# Patient Record
Sex: Male | Born: 1965 | Race: Black or African American | Hispanic: No | Marital: Single | Smoking: Former smoker
Health system: Southern US, Community
[De-identification: ages and names within clinical notes are randomized; demographics above are authoritative.]

## PROBLEM LIST (undated history)

## (undated) DIAGNOSIS — E119 Type 2 diabetes mellitus without complications: Secondary | ICD-10-CM

## (undated) DIAGNOSIS — I10 Essential (primary) hypertension: Secondary | ICD-10-CM

## (undated) DIAGNOSIS — G4730 Sleep apnea, unspecified: Secondary | ICD-10-CM

---
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
## 2010-02-02 ENCOUNTER — Emergency Department (HOSPITAL_COMMUNITY): Admission: EM | Admit: 2010-02-02 | Discharge: 2010-02-03 | Payer: Self-pay | Admitting: Emergency Medicine

## 2010-11-17 LAB — CBC
HCT: 43.7 % (ref 39.0–52.0)
Hemoglobin: 14.8 g/dL (ref 13.0–17.0)
MCHC: 34 g/dL (ref 30.0–36.0)
RDW: 14.3 % (ref 11.5–15.5)

## 2010-11-17 LAB — COMPREHENSIVE METABOLIC PANEL
ALT: 48 U/L (ref 0–53)
AST: 30 U/L (ref 0–37)
Albumin: 4.5 g/dL (ref 3.5–5.2)
Alkaline Phosphatase: 67 U/L (ref 39–117)
BUN: 16 mg/dL (ref 6–23)
GFR calc Af Amer: >60 mL/min (ref >60)
GFR calc non Af Amer: 53 mL/min — ABNORMAL LOW (ref >60)
Potassium: 3.3 mEq/L — ABNORMAL LOW (ref 3.5–5.1)
Sodium: 139 mEq/L (ref 135–145)
Total Bilirubin: 1 mg/dL (ref 0.3–1.2)

## 2010-11-17 LAB — POCT I-STAT, CHEM 8
BUN: 17 mg/dL (ref 6–23)
Calcium, Ion: 1.03 mmol/L — ABNORMAL LOW (ref 1.12–1.32)
Chloride: 107 mEq/L (ref 96–112)
Creatinine, Ser: 1.7 mg/dL — ABNORMAL HIGH (ref 0.4–1.5)
Glucose, Bld: 86 mg/dL (ref 70–99)
HCT: 49 % (ref 39.0–52.0)
TCO2: 24 mmol/L (ref 0–100)

## 2010-11-17 LAB — URINALYSIS, ROUTINE W REFLEX MICROSCOPIC
Bilirubin Urine: NEGATIVE
Nitrite: NEGATIVE
Specific Gravity, Urine: 1.025 (ref 1.005–1.030)
pH: 5.5 (ref 5.0–8.0)

## 2010-11-17 LAB — TYPE AND SCREEN

## 2010-11-17 LAB — LACTIC ACID, PLASMA: Lactic Acid, Venous: 1.7 mmol/L (ref 0.5–2.2)

## 2010-11-17 LAB — PROTIME-INR: Prothrombin Time: 12.3 seconds (ref 11.6–15.2)

## 2010-11-17 LAB — ABO/RH: ABO/RH(D): O POS

## 2010-11-17 LAB — APTT: aPTT: 26 seconds (ref 24–37)

## 2018-07-21 ENCOUNTER — Other Ambulatory Visit (HOSPITAL_COMMUNITY): Payer: Self-pay | Admitting: Orthopedic Surgery

## 2018-09-08 ENCOUNTER — Other Ambulatory Visit: Payer: Self-pay

## 2018-09-08 ENCOUNTER — Encounter (HOSPITAL_BASED_OUTPATIENT_CLINIC_OR_DEPARTMENT_OTHER): Payer: Self-pay | Admitting: *Deleted

## 2018-09-14 ENCOUNTER — Encounter (HOSPITAL_BASED_OUTPATIENT_CLINIC_OR_DEPARTMENT_OTHER)
Admission: RE | Admit: 2018-09-14 | Discharge: 2018-09-14 | Disposition: A | Discharge status: Home / Self Care | Payer: No Typology Code available for payment source | Source: Ambulatory Visit | Attending: Orthopedic Surgery | Admitting: Orthopedic Surgery

## 2018-09-14 LAB — BASIC METABOLIC PANEL
ANION GAP: 8 (ref 5–15)
BUN: 12 mg/dL (ref 6–20)
CO2: 30 mmol/L (ref 22–32)
Calcium: 8.8 mg/dL — ABNORMAL LOW (ref 8.9–10.3)
Chloride: 103 mmol/L (ref 98–111)
Creatinine, Ser: 1.2 mg/dL (ref 0.61–1.24)
GFR calc non Af Amer: >60 mL/min (ref >60)
Glucose, Bld: 98 mg/dL (ref 70–99)
Potassium: 4.5 mmol/L (ref 3.5–5.1)
Sodium: 141 mmol/L (ref 135–145)

## 2018-09-14 NOTE — Progress Notes (Signed)
Anesthesia consult per Dr.Germeroth, will proceed with surgery as scheduled.  

## 2018-09-15 ENCOUNTER — Ambulatory Visit (HOSPITAL_BASED_OUTPATIENT_CLINIC_OR_DEPARTMENT_OTHER): Payer: No Typology Code available for payment source | Admitting: Anesthesiology

## 2018-09-15 ENCOUNTER — Inpatient Hospital Stay (HOSPITAL_BASED_OUTPATIENT_CLINIC_OR_DEPARTMENT_OTHER)
Admission: RE | Admit: 2018-09-15 | Discharge: 2018-10-30 | DRG: 981 | Disposition: E | Payer: No Typology Code available for payment source | Attending: Pulmonary Disease | Admitting: Pulmonary Disease

## 2018-09-15 ENCOUNTER — Encounter (HOSPITAL_BASED_OUTPATIENT_CLINIC_OR_DEPARTMENT_OTHER): Payer: Self-pay

## 2018-09-15 ENCOUNTER — Other Ambulatory Visit: Payer: Self-pay

## 2018-09-15 ENCOUNTER — Inpatient Hospital Stay (HOSPITAL_COMMUNITY): Payer: No Typology Code available for payment source

## 2018-09-15 ENCOUNTER — Encounter (HOSPITAL_COMMUNITY): Admission: RE | Disposition: E | Payer: Self-pay | Source: Home / Self Care | Attending: Pulmonary Disease

## 2018-09-15 DIAGNOSIS — J95821 Acute postprocedural respiratory failure: Secondary | ICD-10-CM | POA: Diagnosis present

## 2018-09-15 DIAGNOSIS — T884XXS Failed or difficult intubation, sequela: Secondary | ICD-10-CM

## 2018-09-15 DIAGNOSIS — J9621 Acute and chronic respiratory failure with hypoxia: Secondary | ICD-10-CM | POA: Diagnosis not present

## 2018-09-15 DIAGNOSIS — S93439D Sprain of tibiofibular ligament of unspecified ankle, subsequent encounter: Secondary | ICD-10-CM | POA: Diagnosis not present

## 2018-09-15 DIAGNOSIS — E874 Mixed disorder of acid-base balance: Secondary | ICD-10-CM | POA: Diagnosis not present

## 2018-09-15 DIAGNOSIS — J9601 Acute respiratory failure with hypoxia: Secondary | ICD-10-CM | POA: Diagnosis not present

## 2018-09-15 DIAGNOSIS — Z6841 Body Mass Index (BMI) 40.0 and over, adult: Secondary | ICD-10-CM

## 2018-09-15 DIAGNOSIS — E8770 Fluid overload, unspecified: Secondary | ICD-10-CM | POA: Diagnosis not present

## 2018-09-15 DIAGNOSIS — Z515 Encounter for palliative care: Secondary | ICD-10-CM | POA: Diagnosis not present

## 2018-09-15 DIAGNOSIS — Z7984 Long term (current) use of oral hypoglycemic drugs: Secondary | ICD-10-CM

## 2018-09-15 DIAGNOSIS — R6521 Severe sepsis with septic shock: Secondary | ICD-10-CM | POA: Diagnosis not present

## 2018-09-15 DIAGNOSIS — I10 Essential (primary) hypertension: Secondary | ICD-10-CM | POA: Diagnosis present

## 2018-09-15 DIAGNOSIS — G9341 Metabolic encephalopathy: Secondary | ICD-10-CM | POA: Diagnosis not present

## 2018-09-15 DIAGNOSIS — N170 Acute kidney failure with tubular necrosis: Secondary | ICD-10-CM | POA: Diagnosis not present

## 2018-09-15 DIAGNOSIS — J90 Pleural effusion, not elsewhere classified: Secondary | ICD-10-CM | POA: Diagnosis not present

## 2018-09-15 DIAGNOSIS — E662 Morbid (severe) obesity with alveolar hypoventilation: Secondary | ICD-10-CM | POA: Diagnosis present

## 2018-09-15 DIAGNOSIS — I472 Ventricular tachycardia: Secondary | ICD-10-CM | POA: Diagnosis not present

## 2018-09-15 DIAGNOSIS — E119 Type 2 diabetes mellitus without complications: Secondary | ICD-10-CM | POA: Diagnosis present

## 2018-09-15 DIAGNOSIS — J810 Acute pulmonary edema: Secondary | ICD-10-CM | POA: Diagnosis present

## 2018-09-15 DIAGNOSIS — E872 Acidosis: Secondary | ICD-10-CM

## 2018-09-15 DIAGNOSIS — J9600 Acute respiratory failure, unspecified whether with hypoxia or hypercapnia: Secondary | ICD-10-CM

## 2018-09-15 DIAGNOSIS — Z66 Do not resuscitate: Secondary | ICD-10-CM | POA: Diagnosis not present

## 2018-09-15 DIAGNOSIS — S8252XK Displaced fracture of medial malleolus of left tibia, subsequent encounter for closed fracture with nonunion: Secondary | ICD-10-CM | POA: Diagnosis not present

## 2018-09-15 DIAGNOSIS — G4733 Obstructive sleep apnea (adult) (pediatric): Secondary | ICD-10-CM | POA: Diagnosis not present

## 2018-09-15 DIAGNOSIS — R579 Shock, unspecified: Secondary | ICD-10-CM | POA: Diagnosis not present

## 2018-09-15 DIAGNOSIS — I469 Cardiac arrest, cause unspecified: Secondary | ICD-10-CM | POA: Diagnosis not present

## 2018-09-15 DIAGNOSIS — T884XXA Failed or difficult intubation, initial encounter: Secondary | ICD-10-CM

## 2018-09-15 DIAGNOSIS — X58XXXD Exposure to other specified factors, subsequent encounter: Secondary | ICD-10-CM | POA: Diagnosis present

## 2018-09-15 DIAGNOSIS — F419 Anxiety disorder, unspecified: Secondary | ICD-10-CM | POA: Diagnosis not present

## 2018-09-15 DIAGNOSIS — S82402K Unspecified fracture of shaft of left fibula, subsequent encounter for closed fracture with nonunion: Secondary | ICD-10-CM | POA: Diagnosis present

## 2018-09-15 DIAGNOSIS — K5900 Constipation, unspecified: Secondary | ICD-10-CM | POA: Diagnosis not present

## 2018-09-15 DIAGNOSIS — J811 Chronic pulmonary edema: Secondary | ICD-10-CM | POA: Diagnosis present

## 2018-09-15 DIAGNOSIS — A419 Sepsis, unspecified organism: Secondary | ICD-10-CM | POA: Diagnosis not present

## 2018-09-15 DIAGNOSIS — A409 Streptococcal sepsis, unspecified: Secondary | ICD-10-CM | POA: Diagnosis not present

## 2018-09-15 DIAGNOSIS — Z79899 Other long term (current) drug therapy: Secondary | ICD-10-CM

## 2018-09-15 DIAGNOSIS — T82594A Other mechanical complication of infusion catheter, initial encounter: Secondary | ICD-10-CM

## 2018-09-15 DIAGNOSIS — E876 Hypokalemia: Secondary | ICD-10-CM | POA: Diagnosis not present

## 2018-09-15 DIAGNOSIS — J9602 Acute respiratory failure with hypercapnia: Secondary | ICD-10-CM

## 2018-09-15 DIAGNOSIS — J9690 Respiratory failure, unspecified, unspecified whether with hypoxia or hypercapnia: Secondary | ICD-10-CM

## 2018-09-15 DIAGNOSIS — J80 Acute respiratory distress syndrome: Secondary | ICD-10-CM | POA: Diagnosis not present

## 2018-09-15 DIAGNOSIS — R0902 Hypoxemia: Secondary | ICD-10-CM | POA: Diagnosis not present

## 2018-09-15 DIAGNOSIS — T17908A Unspecified foreign body in respiratory tract, part unspecified causing other injury, initial encounter: Secondary | ICD-10-CM

## 2018-09-15 DIAGNOSIS — Z87891 Personal history of nicotine dependence: Secondary | ICD-10-CM

## 2018-09-15 DIAGNOSIS — S82842K Displaced bimalleolar fracture of left lower leg, subsequent encounter for closed fracture with nonunion: Secondary | ICD-10-CM | POA: Diagnosis present

## 2018-09-15 DIAGNOSIS — E870 Hyperosmolality and hypernatremia: Secondary | ICD-10-CM | POA: Diagnosis not present

## 2018-09-15 DIAGNOSIS — Z978 Presence of other specified devices: Secondary | ICD-10-CM

## 2018-09-15 DIAGNOSIS — Y990 Civilian activity done for income or pay: Secondary | ICD-10-CM | POA: Diagnosis not present

## 2018-09-15 DIAGNOSIS — Z0189 Encounter for other specified special examinations: Secondary | ICD-10-CM

## 2018-09-15 HISTORY — PX: ORIF ANKLE FRACTURE: SHX5408

## 2018-09-15 HISTORY — DX: Type 2 diabetes mellitus without complications: E11.9

## 2018-09-15 HISTORY — DX: Sleep apnea, unspecified: G47.30

## 2018-09-15 HISTORY — DX: Essential (primary) hypertension: I10

## 2018-09-15 LAB — BLOOD GAS, ARTERIAL
Acid-Base Excess: 3.4 mmol/L — ABNORMAL HIGH (ref 0.0–2.0)
Acid-Base Excess: 4.7 mmol/L — ABNORMAL HIGH (ref 0.0–2.0)
Bicarbonate: 32.3 mmol/L — ABNORMAL HIGH (ref 20.0–28.0)
Bicarbonate: 31.2 mmol/L — ABNORMAL HIGH (ref 20.0–28.0)
DRAWN BY: 441371
Drawn by: 441371
FIO2: 100
FIO2: 100
LHR: 30 {breaths}/min
MECHVT: 600 mL
MECHVT: 550 mL
O2 Saturation: 87 %
O2 Saturation: 82.5 %
PATIENT TEMPERATURE: 97.7
PCO2 ART: 68.8 mmHg — AB (ref 32.0–48.0)
PEEP: 5 cmH2O
PEEP: 16 cmH2O
PO2 ART: 48.6 mmHg — AB (ref 83.0–108.0)
Patient temperature: 98.6
RATE: 18 resp/min
pCO2 arterial: 105 mmHg (ref 32.0–48.0)
pH, Arterial: 7.117 — CL (ref 7.350–7.450)
pH, Arterial: 7.276 — ABNORMAL LOW (ref 7.350–7.450)
pO2, Arterial: 65.8 mmHg — ABNORMAL LOW (ref 83.0–108.0)

## 2018-09-15 LAB — CBC WITH DIFFERENTIAL/PLATELET
Abs Immature Granulocytes: 0.11 10*3/uL — ABNORMAL HIGH (ref 0.00–0.07)
BASOS PCT: 1 %
Basophils Absolute: 0.1 10*3/uL (ref 0.0–0.1)
Eosinophils Absolute: 0.1 10*3/uL (ref 0.0–0.5)
Eosinophils Relative: 1 %
HCT: 59.1 % — ABNORMAL HIGH (ref 39.0–52.0)
Hemoglobin: 16.4 g/dL (ref 13.0–17.0)
Immature Granulocytes: 1 %
Lymphocytes Relative: 10 %
Lymphs Abs: 1.2 10*3/uL (ref 0.7–4.0)
MCH: 28.1 pg (ref 26.0–34.0)
MCHC: 27.7 g/dL — AB (ref 30.0–36.0)
MCV: 101.4 fL — ABNORMAL HIGH (ref 80.0–100.0)
Monocytes Absolute: 0.3 10*3/uL (ref 0.1–1.0)
Monocytes Relative: 3 %
Neutro Abs: 10.9 10*3/uL — ABNORMAL HIGH (ref 1.7–7.7)
Neutrophils Relative %: 84 %
Platelets: 289 10*3/uL (ref 150–400)
RBC: 5.83 MIL/uL — AB (ref 4.22–5.81)
RDW: 14.4 % (ref 11.5–15.5)
WBC: 12.7 10*3/uL — ABNORMAL HIGH (ref 4.0–10.5)
nRBC: 0 % (ref 0.0–0.2)

## 2018-09-15 LAB — COMPREHENSIVE METABOLIC PANEL
ALK PHOS: 78 U/L (ref 38–126)
ALT: 14 U/L (ref 0–44)
AST: 19 U/L (ref 15–41)
Albumin: 4 g/dL (ref 3.5–5.0)
Anion gap: 9 (ref 5–15)
BUN: 15 mg/dL (ref 6–20)
CO2: 31 mmol/L (ref 22–32)
Calcium: 8 mg/dL — ABNORMAL LOW (ref 8.9–10.3)
Chloride: 99 mmol/L (ref 98–111)
Creatinine, Ser: 1.69 mg/dL — ABNORMAL HIGH (ref 0.61–1.24)
GFR calc Af Amer: 52 mL/min — ABNORMAL LOW (ref >60)
GFR calc non Af Amer: 45 mL/min — ABNORMAL LOW (ref >60)
Glucose, Bld: 242 mg/dL — ABNORMAL HIGH (ref 70–99)
Potassium: 5.1 mmol/L (ref 3.5–5.1)
SODIUM: 139 mmol/L (ref 135–145)
Total Bilirubin: 0.7 mg/dL (ref 0.3–1.2)
Total Protein: 7.1 g/dL (ref 6.5–8.1)

## 2018-09-15 LAB — RAPID URINE DRUG SCREEN, HOSP PERFORMED
AMPHETAMINES: NOT DETECTED
BENZODIAZEPINES: POSITIVE — AB
Barbiturates: NOT DETECTED
Cocaine: NOT DETECTED
Opiates: NOT DETECTED
Tetrahydrocannabinol: NOT DETECTED

## 2018-09-15 LAB — GLUCOSE, CAPILLARY
GLUCOSE-CAPILLARY: 143 mg/dL — AB (ref 70–99)
Glucose-Capillary: 104 mg/dL — ABNORMAL HIGH (ref 70–99)
Glucose-Capillary: 168 mg/dL — ABNORMAL HIGH (ref 70–99)
Glucose-Capillary: 189 mg/dL — ABNORMAL HIGH (ref 70–99)

## 2018-09-15 LAB — MRSA PCR SCREENING: MRSA by PCR: NEGATIVE

## 2018-09-15 LAB — BRAIN NATRIURETIC PEPTIDE: B Natriuretic Peptide: 271.6 pg/mL — ABNORMAL HIGH (ref 0.0–100.0)

## 2018-09-15 LAB — LACTIC ACID, PLASMA
Lactic Acid, Venous: 1.3 mmol/L (ref 0.5–1.9)
Lactic Acid, Venous: 2 mmol/L (ref 0.5–1.9)

## 2018-09-15 LAB — MAGNESIUM: Magnesium: 2.1 mg/dL (ref 1.7–2.4)

## 2018-09-15 LAB — TROPONIN I: Troponin I: <0.03 ng/mL (ref <0.03)

## 2018-09-15 LAB — D-DIMER, QUANTITATIVE: D-Dimer, Quant: 2.48 ug/mL-FEU — ABNORMAL HIGH (ref 0.00–0.50)

## 2018-09-15 LAB — PHOSPHORUS: PHOSPHORUS: 7.7 mg/dL — AB (ref 2.5–4.6)

## 2018-09-15 LAB — TRIGLYCERIDES: Triglycerides: 138 mg/dL (ref <150)

## 2018-09-15 SURGERY — OPEN REDUCTION INTERNAL FIXATION (ORIF) ANKLE FRACTURE
Anesthesia: General | Site: Ankle | Laterality: Left

## 2018-09-15 MED ORDER — HYDRALAZINE HCL 20 MG/ML IJ SOLN
INTRAMUSCULAR | Status: AC
  Filled 2018-09-15: qty 1

## 2018-09-15 MED ORDER — ONDANSETRON HCL 4 MG PO TABS: 4.0000 mg | ORAL_TABLET | Freq: Four times a day (QID) | ORAL | Status: DC | PRN

## 2018-09-15 MED ORDER — OXYCODONE HCL 5 MG PO TABS: 5.0000 mg | ORAL_TABLET | Freq: Once | ORAL | Status: DC | PRN

## 2018-09-15 MED ORDER — METOCLOPRAMIDE HCL 5 MG/ML IJ SOLN: 5.0000 mg | Freq: Three times a day (TID) | INTRAMUSCULAR | Status: DC | PRN

## 2018-09-15 MED ORDER — PROPOFOL 500 MG/50ML IV EMUL
INTRAVENOUS | Status: AC
  Filled 2018-09-15: qty 50

## 2018-09-15 MED ORDER — FUROSEMIDE 10 MG/ML IJ SOLN: 80.0000 mg | Freq: Once | INTRAMUSCULAR | Status: DC

## 2018-09-15 MED ORDER — FENTANYL CITRATE (PF) 100 MCG/2ML IJ SOLN: 25.0000 ug | INTRAMUSCULAR | Status: DC | PRN

## 2018-09-15 MED ORDER — SODIUM BICARBONATE 8.4 % IV SOLN
INTRAVENOUS | Status: AC
  Administered 2018-09-15: 18:00:00
  Filled 2018-09-15: qty 100

## 2018-09-15 MED ORDER — CHLORHEXIDINE GLUCONATE 4 % EX LIQD: 60.0000 mL | Freq: Once | CUTANEOUS | Status: DC

## 2018-09-15 MED ORDER — ALBUTEROL SULFATE (2.5 MG/3ML) 0.083% IN NEBU
INHALATION_SOLUTION | RESPIRATORY_TRACT | Status: AC
  Filled 2018-09-15: qty 3

## 2018-09-15 MED ORDER — FENTANYL 2500MCG IN NS 250ML (10MCG/ML) PREMIX INFUSION
0.0000 ug/h | INTRAVENOUS | Status: DC
  Administered 2018-09-15: 100 ug/h via INTRAVENOUS
  Administered 2018-09-16 – 2018-09-17 (×5): 400 ug/h via INTRAVENOUS
  Administered 2018-09-17: 200 ug/h via INTRAVENOUS
  Administered 2018-09-17 – 2018-09-19 (×7): 400 ug/h via INTRAVENOUS
  Administered 2018-09-19: 350 ug/h via INTRAVENOUS
  Administered 2018-09-19 – 2018-09-20 (×3): 400 ug/h via INTRAVENOUS
  Administered 2018-09-20: 200 ug/h via INTRAVENOUS
  Administered 2018-09-21: 400 ug/h via INTRAVENOUS
  Administered 2018-09-21 (×2): 350 ug/h via INTRAVENOUS
  Administered 2018-09-22: 325 ug/h via INTRAVENOUS
  Administered 2018-09-22: 400 ug/h via INTRAVENOUS
  Administered 2018-09-22: 375 ug/h via INTRAVENOUS
  Administered 2018-09-22 – 2018-09-23 (×3): 400 ug/h via INTRAVENOUS
  Filled 2018-09-15 (×29): qty 250

## 2018-09-15 MED ORDER — ALBUTEROL SULFATE (2.5 MG/3ML) 0.083% IN NEBU
2.5000 mg | INHALATION_SOLUTION | Freq: Four times a day (QID) | RESPIRATORY_TRACT | Status: DC | PRN
  Administered 2018-09-15: 2.5 mg via RESPIRATORY_TRACT

## 2018-09-15 MED ORDER — FUROSEMIDE 10 MG/ML IJ SOLN
5.0000 mg/h | INTRAVENOUS | Status: DC
  Administered 2018-09-15: 5 mg/h via INTRAVENOUS
  Filled 2018-09-15: qty 25

## 2018-09-15 MED ORDER — DOCUSATE SODIUM 100 MG PO CAPS
100.0000 mg | ORAL_CAPSULE | Freq: Two times a day (BID) | ORAL | Status: DC
  Administered 2018-09-16: 100 mg via ORAL
  Filled 2018-09-15 (×2): qty 1

## 2018-09-15 MED ORDER — MEPERIDINE HCL 25 MG/ML IJ SOLN: 6.2500 mg | INTRAMUSCULAR | Status: DC | PRN

## 2018-09-15 MED ORDER — MIDAZOLAM HCL 2 MG/2ML IJ SOLN
INTRAMUSCULAR | Status: AC
  Filled 2018-09-15: qty 2

## 2018-09-15 MED ORDER — PROPOFOL 1000 MG/100ML IV EMUL: 25.0000 ug/kg/min | INTRAVENOUS | Status: DC

## 2018-09-15 MED ORDER — ETOMIDATE 2 MG/ML IV SOLN
20.0000 mg | Freq: Once | INTRAVENOUS | Status: AC
  Administered 2018-09-15: 20 mg via INTRAVENOUS

## 2018-09-15 MED ORDER — LACTATED RINGERS IV SOLN
INTRAVENOUS | Status: DC
  Administered 2018-09-15 (×2): via INTRAVENOUS

## 2018-09-15 MED ORDER — METHOCARBAMOL 500 MG PO TABS: 500.0000 mg | ORAL_TABLET | Freq: Four times a day (QID) | ORAL | Status: DC | PRN

## 2018-09-15 MED ORDER — CLONIDINE HCL (ANALGESIA) 100 MCG/ML EP SOLN
EPIDURAL | Status: DC | PRN
  Administered 2018-09-15: 100 ug

## 2018-09-15 MED ORDER — DEXAMETHASONE SODIUM PHOSPHATE 10 MG/ML IJ SOLN
INTRAMUSCULAR | Status: DC | PRN
  Administered 2018-09-15: 5 mg via INTRAVENOUS

## 2018-09-15 MED ORDER — NOREPINEPHRINE-SODIUM CHLORIDE 4-0.9 MG/250ML-% IV SOLN
INTRAVENOUS | Status: AC
  Filled 2018-09-15: qty 250

## 2018-09-15 MED ORDER — HYDRALAZINE HCL 20 MG/ML IJ SOLN
20.0000 mg | Freq: Once | INTRAMUSCULAR | Status: AC
  Administered 2018-09-15: 20 mg via INTRAVENOUS

## 2018-09-15 MED ORDER — HYDROMORPHONE HCL 1 MG/ML IJ SOLN: 0.5000 mg | INTRAMUSCULAR | Status: DC | PRN

## 2018-09-15 MED ORDER — FUROSEMIDE 10 MG/ML IJ SOLN: 120.0000 mg | Freq: Once | INTRAVENOUS | Status: AC

## 2018-09-15 MED ORDER — DOCUSATE SODIUM 100 MG PO CAPS: 100.0000 mg | ORAL_CAPSULE | Freq: Two times a day (BID) | ORAL | 0 refills | Status: AC

## 2018-09-15 MED ORDER — ONDANSETRON HCL 4 MG/2ML IJ SOLN: 4.0000 mg | Freq: Four times a day (QID) | INTRAMUSCULAR | Status: DC | PRN

## 2018-09-15 MED ORDER — SODIUM CHLORIDE 0.9 % IV SOLN
INTRAVENOUS | Status: DC
  Administered 2018-09-15 – 2018-09-18 (×6): via INTRAVENOUS

## 2018-09-15 MED ORDER — HYDROCODONE-ACETAMINOPHEN 5-325 MG PO TABS: 1.0000 | ORAL_TABLET | ORAL | Status: DC | PRN

## 2018-09-15 MED ORDER — SODIUM CHLORIDE 0.9 % IV SOLN: INTRAVENOUS | Status: DC

## 2018-09-15 MED ORDER — ROCURONIUM BROMIDE 50 MG/5ML IV SOLN
50.0000 mg | Freq: Once | INTRAVENOUS | Status: AC
  Administered 2018-09-15: 50 mg via INTRAVENOUS
  Filled 2018-09-15: qty 5

## 2018-09-15 MED ORDER — GLIPIZIDE 5 MG PO TABS: 5.0000 mg | ORAL_TABLET | Freq: Two times a day (BID) | ORAL | Status: DC

## 2018-09-15 MED ORDER — FENTANYL CITRATE (PF) 100 MCG/2ML IJ SOLN
100.0000 ug | Freq: Once | INTRAMUSCULAR | Status: AC | PRN
  Administered 2018-09-16: 100 ug via INTRAVENOUS

## 2018-09-15 MED ORDER — METOCLOPRAMIDE HCL 5 MG PO TABS: 5.0000 mg | ORAL_TABLET | Freq: Three times a day (TID) | ORAL | Status: DC | PRN

## 2018-09-15 MED ORDER — PROPOFOL 1000 MG/100ML IV EMUL
5.0000 ug/kg/min | INTRAVENOUS | Status: DC
  Administered 2018-09-15: 80 ug/kg/min via INTRAVENOUS
  Administered 2018-09-15: 10 ug/kg/min via INTRAVENOUS
  Administered 2018-09-15: 80 ug/kg/min via INTRAVENOUS
  Administered 2018-09-16: 60 ug/kg/min via INTRAVENOUS
  Administered 2018-09-16: 50 ug/kg/min via INTRAVENOUS
  Administered 2018-09-16: 70 ug/kg/min via INTRAVENOUS
  Administered 2018-09-16: 60 ug/kg/min via INTRAVENOUS
  Administered 2018-09-16: 55 ug/kg/min via INTRAVENOUS
  Administered 2018-09-16 (×2): 60 ug/kg/min via INTRAVENOUS
  Administered 2018-09-16: 70 ug/kg/min via INTRAVENOUS
  Administered 2018-09-16: 65 ug/kg/min via INTRAVENOUS
  Administered 2018-09-16: 60 ug/kg/min via INTRAVENOUS
  Administered 2018-09-16 (×2): 50 ug/kg/min via INTRAVENOUS
  Administered 2018-09-17: 55 ug/kg/min via INTRAVENOUS
  Administered 2018-09-17: 50 ug/kg/min via INTRAVENOUS
  Administered 2018-09-17: 55 ug/kg/min via INTRAVENOUS
  Administered 2018-09-17: 50 ug/kg/min via INTRAVENOUS
  Administered 2018-09-17: 60 ug/kg/min via INTRAVENOUS
  Administered 2018-09-17 (×2): 50 ug/kg/min via INTRAVENOUS
  Administered 2018-09-17: 30 ug/kg/min via INTRAVENOUS
  Administered 2018-09-17: 50 ug/kg/min via INTRAVENOUS
  Administered 2018-09-17: 55 ug/kg/min via INTRAVENOUS
  Administered 2018-09-17: 40 ug/kg/min via INTRAVENOUS
  Administered 2018-09-18: 35 ug/kg/min via INTRAVENOUS
  Administered 2018-09-18: 55 ug/kg/min via INTRAVENOUS
  Administered 2018-09-18: 40 ug/kg/min via INTRAVENOUS
  Administered 2018-09-18: 45 ug/kg/min via INTRAVENOUS
  Administered 2018-09-18 (×2): 40 ug/kg/min via INTRAVENOUS
  Administered 2018-09-18 (×2): 45 ug/kg/min via INTRAVENOUS
  Administered 2018-09-18: 40 ug/kg/min via INTRAVENOUS
  Administered 2018-09-19 (×2): 45 ug/kg/min via INTRAVENOUS
  Administered 2018-09-19: 20 ug/kg/min via INTRAVENOUS
  Administered 2018-09-19: 45 ug/kg/min via INTRAVENOUS
  Administered 2018-09-19: 20 ug/kg/min via INTRAVENOUS
  Administered 2018-09-20 (×2): 10 ug/kg/min via INTRAVENOUS
  Administered 2018-09-21: 15 ug/kg/min via INTRAVENOUS
  Administered 2018-09-21 – 2018-09-22 (×5): 25 ug/kg/min via INTRAVENOUS
  Administered 2018-09-22: 20 ug/kg/min via INTRAVENOUS
  Administered 2018-09-22 – 2018-09-23 (×2): 25 ug/kg/min via INTRAVENOUS
  Administered 2018-09-23 (×2): 35 ug/kg/min via INTRAVENOUS
  Filled 2018-09-15: qty 200
  Filled 2018-09-15 (×6): qty 100
  Filled 2018-09-15 (×2): qty 200
  Filled 2018-09-15: qty 300
  Filled 2018-09-15: qty 100
  Filled 2018-09-15: qty 200
  Filled 2018-09-15: qty 100
  Filled 2018-09-15: qty 200
  Filled 2018-09-15: qty 100
  Filled 2018-09-15 (×3): qty 200
  Filled 2018-09-15 (×4): qty 100
  Filled 2018-09-15: qty 200
  Filled 2018-09-15: qty 100
  Filled 2018-09-15: qty 200
  Filled 2018-09-15 (×2): qty 100
  Filled 2018-09-15: qty 200
  Filled 2018-09-15 (×2): qty 100
  Filled 2018-09-15 (×2): qty 200
  Filled 2018-09-15 (×5): qty 100
  Filled 2018-09-15: qty 200

## 2018-09-15 MED ORDER — ACETAMINOPHEN 325 MG PO TABS: 325.0000 mg | ORAL_TABLET | ORAL | Status: DC | PRN

## 2018-09-15 MED ORDER — INSULIN ASPART 100 UNIT/ML ~~LOC~~ SOLN: 0.0000 [IU] | Freq: Every day | SUBCUTANEOUS | Status: DC

## 2018-09-15 MED ORDER — CEFAZOLIN SODIUM-DEXTROSE 2-4 GM/100ML-% IV SOLN
INTRAVENOUS | Status: AC
  Filled 2018-09-15: qty 200

## 2018-09-15 MED ORDER — INSULIN ASPART 100 UNIT/ML ~~LOC~~ SOLN: 0.0000 [IU] | SUBCUTANEOUS | Status: DC

## 2018-09-15 MED ORDER — INSULIN ASPART 100 UNIT/ML ~~LOC~~ SOLN
0.0000 [IU] | SUBCUTANEOUS | Status: DC
  Administered 2018-09-15 – 2018-09-16 (×2): 4 [IU] via SUBCUTANEOUS
  Administered 2018-09-16: 3 [IU] via SUBCUTANEOUS
  Administered 2018-09-16: 4 [IU] via SUBCUTANEOUS

## 2018-09-15 MED ORDER — SODIUM CHLORIDE 0.9 % IV SOLN
INTRAVENOUS | Status: DC | PRN
  Administered 2018-09-15: 50 ug/min via INTRAVENOUS

## 2018-09-15 MED ORDER — ARTIFICIAL TEARS OPHTHALMIC OINT
1.0000 "application " | TOPICAL_OINTMENT | Freq: Three times a day (TID) | OPHTHALMIC | Status: DC
  Administered 2018-09-16 – 2018-09-17 (×5): 1 via OPHTHALMIC
  Filled 2018-09-15: qty 3.5

## 2018-09-15 MED ORDER — METFORMIN HCL 500 MG PO TABS
500.0000 mg | ORAL_TABLET | Freq: Two times a day (BID) | ORAL | Status: DC
  Filled 2018-09-15 (×2): qty 1

## 2018-09-15 MED ORDER — LISINOPRIL 10 MG PO TABS: 10.0000 mg | ORAL_TABLET | Freq: Every day | ORAL | Status: DC

## 2018-09-15 MED ORDER — FENTANYL CITRATE (PF) 100 MCG/2ML IJ SOLN
100.0000 ug | Freq: Once | INTRAMUSCULAR | Status: AC
  Administered 2018-09-15: 100 ug via INTRAVENOUS

## 2018-09-15 MED ORDER — SCOPOLAMINE 1 MG/3DAYS TD PT72: 1.0000 | MEDICATED_PATCH | Freq: Once | TRANSDERMAL | Status: DC | PRN

## 2018-09-15 MED ORDER — SODIUM BICARBONATE 8.4 % IV SOLN
INTRAVENOUS | Status: DC
  Administered 2018-09-15: 20:00:00 via INTRAVENOUS
  Filled 2018-09-15 (×2): qty 150

## 2018-09-15 MED ORDER — METHOCARBAMOL 1000 MG/10ML IJ SOLN
500.0000 mg | Freq: Four times a day (QID) | INTRAVENOUS | Status: DC | PRN
  Filled 2018-09-15: qty 5

## 2018-09-15 MED ORDER — DEXTROSE 5 % IV SOLN
3.0000 g | INTRAVENOUS | Status: AC
  Administered 2018-09-15: 3 g via INTRAVENOUS

## 2018-09-15 MED ORDER — FENTANYL CITRATE (PF) 100 MCG/2ML IJ SOLN
50.0000 ug | INTRAMUSCULAR | Status: DC | PRN
  Administered 2018-09-15: 50 ug via INTRAVENOUS

## 2018-09-15 MED ORDER — VANCOMYCIN HCL 500 MG IV SOLR
INTRAVENOUS | Status: DC | PRN
  Administered 2018-09-15: 500 mg

## 2018-09-15 MED ORDER — CISATRACURIUM BOLUS VIA INFUSION
0.0500 mg/kg | Freq: Once | INTRAVENOUS | Status: AC
  Administered 2018-09-15: 7.6 mg via INTRAVENOUS
  Filled 2018-09-15: qty 8

## 2018-09-15 MED ORDER — FENTANYL BOLUS VIA INFUSION
50.0000 ug | INTRAVENOUS | Status: DC | PRN
  Filled 2018-09-15: qty 50

## 2018-09-15 MED ORDER — NOREPINEPHRINE-SODIUM CHLORIDE 4-0.9 MG/250ML-% IV SOLN
0.0000 ug/min | INTRAVENOUS | Status: DC
  Administered 2018-09-16: 4 ug/min via INTRAVENOUS
  Administered 2018-09-17: 4.5 ug/min via INTRAVENOUS
  Administered 2018-09-17: 6 ug/min via INTRAVENOUS
  Administered 2018-09-22: 20 ug/min via INTRAVENOUS
  Administered 2018-09-25: 2 ug/min via INTRAVENOUS
  Administered 2018-09-27: 14 ug/min via INTRAVENOUS
  Administered 2018-09-28: 12 ug/min via INTRAVENOUS
  Administered 2018-09-28: 22 ug/min via INTRAVENOUS
  Administered 2018-09-28: 13 ug/min via INTRAVENOUS
  Administered 2018-09-28: 11 ug/min via INTRAVENOUS
  Administered 2018-09-28: 12 ug/min via INTRAVENOUS
  Administered 2018-09-29: 4 ug/min via INTRAVENOUS
  Administered 2018-09-29: 11 ug/min via INTRAVENOUS
  Administered 2018-09-29: 20 ug/min via INTRAVENOUS
  Administered 2018-09-30: 10 ug/min via INTRAVENOUS
  Administered 2018-10-01: 12 ug/min via INTRAVENOUS
  Administered 2018-10-01: 14 ug/min via INTRAVENOUS
  Administered 2018-10-01: 12 ug/min via INTRAVENOUS
  Filled 2018-09-15 (×19): qty 250

## 2018-09-15 MED ORDER — MIDAZOLAM HCL 2 MG/2ML IJ SOLN
1.0000 mg | INTRAMUSCULAR | Status: DC | PRN
  Administered 2018-09-15: 1 mg via INTRAVENOUS

## 2018-09-15 MED ORDER — LABETALOL HCL 5 MG/ML IV SOLN
INTRAVENOUS | Status: AC
  Filled 2018-09-15: qty 4

## 2018-09-15 MED ORDER — FENTANYL CITRATE (PF) 100 MCG/2ML IJ SOLN
INTRAMUSCULAR | Status: AC
  Filled 2018-09-15: qty 2

## 2018-09-15 MED ORDER — ONDANSETRON HCL 4 MG/2ML IJ SOLN: 4.0000 mg | Freq: Once | INTRAMUSCULAR | Status: DC | PRN

## 2018-09-15 MED ORDER — FENTANYL CITRATE (PF) 100 MCG/2ML IJ SOLN
INTRAMUSCULAR | Status: AC
  Administered 2018-09-15: 100 ug
  Filled 2018-09-15: qty 2

## 2018-09-15 MED ORDER — VANCOMYCIN HCL 1000 MG IV SOLR
INTRAVENOUS | Status: AC
  Filled 2018-09-15: qty 1000

## 2018-09-15 MED ORDER — ASPIRIN EC 81 MG PO TBEC: 81.0000 mg | DELAYED_RELEASE_TABLET | Freq: Two times a day (BID) | ORAL | 0 refills | Status: AC

## 2018-09-15 MED ORDER — FENTANYL 2500MCG IN NS 250ML (10MCG/ML) PREMIX INFUSION: 100.0000 ug/h | INTRAVENOUS | Status: DC

## 2018-09-15 MED ORDER — NITROGLYCERIN IN D5W 200-5 MCG/ML-% IV SOLN: 0.0000 ug/min | INTRAVENOUS | Status: DC

## 2018-09-15 MED ORDER — OXYCODONE HCL 5 MG PO TABS: 5.0000 mg | ORAL_TABLET | ORAL | 0 refills | Status: AC | PRN

## 2018-09-15 MED ORDER — ROPIVACAINE HCL 7.5 MG/ML IJ SOLN
INTRAMUSCULAR | Status: DC | PRN
  Administered 2018-09-15: 30 mL via PERINEURAL

## 2018-09-15 MED ORDER — PROPOFOL 10 MG/ML IV BOLUS
INTRAVENOUS | Status: DC | PRN
  Administered 2018-09-15: 50 mg via INTRAVENOUS
  Administered 2018-09-15: 200 mg via INTRAVENOUS

## 2018-09-15 MED ORDER — SODIUM CHLORIDE 0.9 % IV SOLN
3.0000 ug/kg/min | INTRAVENOUS | Status: DC
  Administered 2018-09-15: 3 ug/kg/min via INTRAVENOUS
  Filled 2018-09-15 (×2): qty 20

## 2018-09-15 MED ORDER — AMMONIA AROMATIC IN INHA
RESPIRATORY_TRACT | Status: AC
  Filled 2018-09-15: qty 10

## 2018-09-15 MED ORDER — PROPOFOL 1000 MG/100ML IV EMUL
INTRAVENOUS | Status: AC
  Filled 2018-09-15: qty 100

## 2018-09-15 MED ORDER — OXYCODONE HCL 5 MG PO TABS: 5.0000 mg | ORAL_TABLET | ORAL | Status: DC | PRN

## 2018-09-15 MED ORDER — MIDAZOLAM HCL 2 MG/2ML IJ SOLN
2.0000 mg | Freq: Once | INTRAMUSCULAR | Status: AC
  Administered 2018-09-15: 2 mg via INTRAVENOUS

## 2018-09-15 MED ORDER — PANTOPRAZOLE SODIUM 40 MG IV SOLR
40.0000 mg | Freq: Every day | INTRAVENOUS | Status: DC
  Administered 2018-09-15 – 2018-09-24 (×10): 40 mg via INTRAVENOUS
  Filled 2018-09-15 (×10): qty 40

## 2018-09-15 MED ORDER — ACETAMINOPHEN 325 MG PO TABS
650.0000 mg | ORAL_TABLET | ORAL | Status: DC | PRN
  Administered 2018-09-19 – 2018-10-01 (×18): 650 mg via ORAL
  Filled 2018-09-15 (×19): qty 2

## 2018-09-15 MED ORDER — INSULIN ASPART 100 UNIT/ML ~~LOC~~ SOLN: 0.0000 [IU] | Freq: Three times a day (TID) | SUBCUTANEOUS | Status: DC

## 2018-09-15 MED ORDER — SENNA 8.6 MG PO TABS
1.0000 | ORAL_TABLET | Freq: Two times a day (BID) | ORAL | Status: DC
  Administered 2018-09-16 – 2018-09-17 (×3): 8.6 mg via ORAL
  Filled 2018-09-15 (×3): qty 1

## 2018-09-15 MED ORDER — OXYCODONE HCL 5 MG/5ML PO SOLN: 5.0000 mg | Freq: Once | ORAL | Status: DC | PRN

## 2018-09-15 MED ORDER — LABETALOL HCL 5 MG/ML IV SOLN
10.0000 mg | INTRAVENOUS | Status: DC | PRN
  Administered 2018-09-15: 10 mg via INTRAVENOUS

## 2018-09-15 MED ORDER — FUROSEMIDE 10 MG/ML IJ SOLN
INTRAMUSCULAR | Status: AC
  Administered 2018-09-15: 120 mg
  Filled 2018-09-15: qty 12

## 2018-09-15 MED ORDER — ACETAMINOPHEN 160 MG/5ML PO SOLN: 325.0000 mg | ORAL | Status: DC | PRN

## 2018-09-15 MED ORDER — SENNA 8.6 MG PO TABS: 2.0000 | ORAL_TABLET | Freq: Two times a day (BID) | ORAL | 0 refills | Status: AC

## 2018-09-15 SURGICAL SUPPLY — 86 items
BANDAGE ESMARK 6X9 LF (GAUZE/BANDAGES/DRESSINGS) ×1 IMPLANT
BIT DRILL 2.5X2.75 QC CALB (BIT) ×2 IMPLANT
BIT DRILL 2.9 CANN QC NONSTRL (BIT) ×2 IMPLANT
BIT DRILL 3.5X5.5 QC CALB (BIT) ×2 IMPLANT
BLADE SURG 15 STRL LF DISP TIS (BLADE) ×2 IMPLANT
BLADE SURG 15 STRL SS (BLADE) ×4
BNDG COHESIVE 4X5 TAN STRL (GAUZE/BANDAGES/DRESSINGS) ×3 IMPLANT
BNDG COHESIVE 6X5 TAN STRL LF (GAUZE/BANDAGES/DRESSINGS) ×3 IMPLANT
BNDG ESMARK 4X9 LF (GAUZE/BANDAGES/DRESSINGS) IMPLANT
BNDG ESMARK 6X9 LF (GAUZE/BANDAGES/DRESSINGS)
CANISTER SUCT 1200ML W/VALVE (MISCELLANEOUS) ×3 IMPLANT
CHLORAPREP W/TINT 26ML (MISCELLANEOUS) ×3 IMPLANT
COVER BACK TABLE 60X90IN (DRAPES) ×3 IMPLANT
COVER WAND RF STERILE (DRAPES) IMPLANT
CUFF TOURNIQUET SINGLE 34IN LL (TOURNIQUET CUFF) IMPLANT
DECANTER SPIKE VIAL GLASS SM (MISCELLANEOUS) IMPLANT
DRAPE EXTREMITY T 121X128X90 (DISPOSABLE) ×3 IMPLANT
DRAPE OEC MINIVIEW 54X84 (DRAPES) ×3 IMPLANT
DRAPE U-SHAPE 47X51 STRL (DRAPES) ×3 IMPLANT
DRSG MEPITEL 4X7.2 (GAUZE/BANDAGES/DRESSINGS) ×3 IMPLANT
DRSG PAD ABDOMINAL 8X10 ST (GAUZE/BANDAGES/DRESSINGS) ×6 IMPLANT
ELECT REM PT RETURN 9FT ADLT (ELECTROSURGICAL) ×3
ELECTRODE REM PT RTRN 9FT ADLT (ELECTROSURGICAL) ×1 IMPLANT
GAUZE SPONGE 4X4 12PLY STRL (GAUZE/BANDAGES/DRESSINGS) ×3 IMPLANT
GLOVE BIO SURGEON STRL SZ 6.5 (GLOVE) ×2 IMPLANT
GLOVE BIO SURGEON STRL SZ8 (GLOVE) ×3 IMPLANT
GLOVE BIO SURGEONS STRL SZ 6.5 (GLOVE) ×2
GLOVE BIOGEL PI IND STRL 7.0 (GLOVE) IMPLANT
GLOVE BIOGEL PI IND STRL 8 (GLOVE) ×2 IMPLANT
GLOVE BIOGEL PI INDICATOR 7.0 (GLOVE) ×6
GLOVE BIOGEL PI INDICATOR 8 (GLOVE) ×4
GLOVE ECLIPSE 8.0 STRL XLNG CF (GLOVE) ×3 IMPLANT
GOWN STRL REUS W/ TWL LRG LVL3 (GOWN DISPOSABLE) ×1 IMPLANT
GOWN STRL REUS W/ TWL XL LVL3 (GOWN DISPOSABLE) ×2 IMPLANT
GOWN STRL REUS W/TWL LRG LVL3 (GOWN DISPOSABLE) ×2
GOWN STRL REUS W/TWL XL LVL3 (GOWN DISPOSABLE) ×4
K-WIRE ACE 1.6X6 (WIRE) ×6
KWIRE ACE 1.6X6 (WIRE) IMPLANT
NEEDLE HYPO 22GX1.5 SAFETY (NEEDLE) IMPLANT
NS IRRIG 1000ML POUR BTL (IV SOLUTION) ×3 IMPLANT
PACK BASIN DAY SURGERY FS (CUSTOM PROCEDURE TRAY) ×3 IMPLANT
PAD CAST 4YDX4 CTTN HI CHSV (CAST SUPPLIES) ×1 IMPLANT
PADDING CAST ABS 4INX4YD NS (CAST SUPPLIES) ×2
PADDING CAST ABS COTTON 4X4 ST (CAST SUPPLIES) IMPLANT
PADDING CAST COTTON 4X4 STRL (CAST SUPPLIES) ×2
PADDING CAST COTTON 6X4 STRL (CAST SUPPLIES) ×3 IMPLANT
PENCIL BUTTON HOLSTER BLD 10FT (ELECTRODE) ×3 IMPLANT
PLATE ACE 100DEG 8HOLE (Plate) ×2 IMPLANT
SANITIZER HAND PURELL 535ML FO (MISCELLANEOUS) ×3 IMPLANT
SCREW ACE CAN 4.0 40M (Screw) ×4 IMPLANT
SCREW CORTICAL 3.5MM  16MM (Screw) ×4 IMPLANT
SCREW CORTICAL 3.5MM  20MM (Screw) ×2 IMPLANT
SCREW CORTICAL 3.5MM  55MM (Screw) ×2 IMPLANT
SCREW CORTICAL 3.5MM  60MM (Screw) ×2 IMPLANT
SCREW CORTICAL 3.5MM 14MM (Screw) ×2 IMPLANT
SCREW CORTICAL 3.5MM 16MM (Screw) IMPLANT
SCREW CORTICAL 3.5MM 18MM (Screw) ×2 IMPLANT
SCREW CORTICAL 3.5MM 20MM (Screw) IMPLANT
SCREW CORTICAL 3.5MM 24MM (Screw) ×2 IMPLANT
SCREW CORTICAL 3.5MM 55MM (Screw) IMPLANT
SCREW CORTICAL 3.5MM 60MM (Screw) IMPLANT
SHEET MEDIUM DRAPE 40X70 STRL (DRAPES) ×3 IMPLANT
SLEEVE SCD COMPRESS KNEE MED (MISCELLANEOUS) ×3 IMPLANT
SPLINT FAST PLASTER 5X30 (CAST SUPPLIES) ×40
SPLINT PLASTER CAST FAST 5X30 (CAST SUPPLIES) ×20 IMPLANT
SPONGE LAP 18X18 RF (DISPOSABLE) ×3 IMPLANT
STAPLER VISISTAT 35W (STAPLE) ×2 IMPLANT
STOCKINETTE 6  STRL (DRAPES) ×2
STOCKINETTE 6 STRL (DRAPES) ×1 IMPLANT
SUCTION FRAZIER HANDLE 10FR (MISCELLANEOUS) ×2
SUCTION TUBE FRAZIER 10FR DISP (MISCELLANEOUS) ×1 IMPLANT
SUT ETHILON 3 0 PS 1 (SUTURE) ×3 IMPLANT
SUT FIBERWIRE #2 38 T-5 BLUE (SUTURE)
SUT MNCRL AB 3-0 PS2 18 (SUTURE) IMPLANT
SUT VIC AB 0 SH 27 (SUTURE) IMPLANT
SUT VIC AB 2-0 SH 27 (SUTURE) ×2
SUT VIC AB 2-0 SH 27XBRD (SUTURE) ×1 IMPLANT
SUTURE FIBERWR #2 38 T-5 BLUE (SUTURE) IMPLANT
SYR BULB 3OZ (MISCELLANEOUS) ×3 IMPLANT
SYR CONTROL 10ML LL (SYRINGE) IMPLANT
TOWEL GREEN STERILE FF (TOWEL DISPOSABLE) ×6 IMPLANT
TUBE CONNECTING 20'X1/4 (TUBING) ×1
TUBE CONNECTING 20X1/4 (TUBING) ×2 IMPLANT
UNDERPAD 30X30 (UNDERPADS AND DIAPERS) ×3 IMPLANT
WASHER FLAT ACE (Orthopedic Implant) ×2 IMPLANT
WASHER PLAIN FLAT ACE NS 3PK (Orthopedic Implant) IMPLANT

## 2018-09-15 NOTE — Procedures (Addendum)
Intubation Procedure Note Rodney Hoffman [id number] 29-Apr-1966  Procedure: Intubation Indications: Respiratory insufficiency  Procedure Details Consent: Unable to obtain consent because of altered level of consciousness. Time Out: Verified patient identification, verified procedure, site/side was marked, verified correct patient position, special equipment/implants available, medications/allergies/relevent history reviewed, required imaging and test results available.  Performed  Mac 4 glidescope  Medications: 161mg fentanyl 261mversed 2069mtomidate 83m43mcuronium   Grade 4 airway view   Evaluation Hemodynamic Status: Transient hypertension requiring treatment; O2 sats: transiently fell during during procedure Patient's Current Condition: stable Complications: No apparent complications Patient did tolerate procedure well. Chest X-ray ordered to verify placement.  CXR: tube position acceptable.   Rodney Hoffman 09/22/2018  I was present and assisted with the procedure  WesaRush FarmerD. [facility]. Pager: [phone] hours pager: [phone]

## 2018-09-15 NOTE — Procedures (Signed)
Central Venous Catheter Insertion Procedure Note Rodney Hoffman [id number] 29-Dec-1965  Procedure: Insertion of Central Venous Catheter Indications: Assessment of intravascular volume, Drug and/or fluid administration and Frequent blood sampling  Procedure Details Consent: Unable to obtain consent because of emergent medical necessity. Time Out: Verified patient identification, verified procedure, site/side was marked, verified correct patient position, special equipment/implants available, medications/allergies/relevent history reviewed, required imaging and test results available.  Performed  Maximum sterile technique was used including antiseptics, cap, gloves, gown, hand hygiene, mask and sheet. Skin prep: Chlorhexidine; local anesthetic administered A antimicrobial bonded/coated triple lumen catheter was placed in the left internal jugular vein using the Seldinger technique.  Evaluation Blood flow good Complications: No apparent complications Patient did tolerate procedure well. Chest X-ray ordered to verify placement.  CXR: pending.  U/S used in placement  Rodney Hoffman 09/07/2018, 6:58 PM

## 2018-09-15 NOTE — Progress Notes (Signed)
Vent settings changed by MD. 

## 2018-09-15 NOTE — Procedures (Signed)
Arterial Catheter Insertion Procedure Note Rodney Hoffman [id number] [birth year]  Procedure: Insertion of Arterial Catheter  Indications: Blood pressure monitoring and Frequent blood sampling  Procedure Details Consent: Unable to obtain consent because of emergent medical necessity. Time Out: Verified patient identification, verified procedure, site/side was marked, verified correct patient position, special equipment/implants available, medications/allergies/relevent history reviewed, required imaging and test results available.  Performed  Maximum sterile technique was used including antiseptics, cap, gloves, gown, hand hygiene, mask and sheet. Skin prep: Chlorhexidine; local anesthetic administered 20 gauge catheter was inserted into left radial artery using the Seldinger technique. ULTRASOUND GUIDANCE USED: NO Evaluation Blood flow good; BP tracing good. Complications: No apparent complications.   Rodney Hoffman 09/10/2018

## 2018-09-15 NOTE — Procedures (Signed)
Bronchoscopy Procedure Note Jariel Snitzer [id number] July 07, 1966  Procedure: Bronchoscopy Indications: Diagnostic evaluation of the airways  Procedure Details Consent: Unable to obtain consent because of emergent medical necessity. Time Out: Verified patient identification, verified procedure, site/side was marked, verified correct patient position, special equipment/implants available, medications/allergies/relevent history reviewed, required imaging and test results available.  Performed  In preparation for procedure, patient was given 100% FiO2 and bronchoscope lubricated. Sedation: Benzodiazepines, Muscle relaxants, Etomidate and Fentanyl  Airway entered and the following bronchi were examined: RUL, RML, RLL, LUL, LLL and Bronchi.   Procedures performed: Brushings performed Bronchoscope removed.  , Patient placed back on 100% FiO2 at conclusion of procedure.    Evaluation Hemodynamic Status: BP stable throughout; O2 sats: stable throughout Patient's Current Condition: stable Specimens:  None Complications: No apparent complications Patient did tolerate procedure well.   Sheneka Schrom 09/18/18

## 2018-09-15 NOTE — Progress Notes (Signed)
eLink Physician-Brief Progress Note Patient Name: Rodney Hoffman DOB: Oct 08, 1965 MRN: [id number]   Date of Service  09/02/2018  HPI/Events of Note  Nursing request for CXR review following central line insertion  eICU Interventions  CVL in good position, no pneumothorax     Intervention Category Minor Interventions: Agitation / anxiety - evaluation and management  Migdalia Dk 09/18/2018, 7:44 PM

## 2018-09-15 NOTE — Progress Notes (Signed)
Patient admitted from OR with oral and nasal airway .  Dr. Angie Fava at bedside to evaluate pateint.  Patient unresonsive, Dr. Angie Fava at bedside continually during patient stay . Respirations are labored at 24-26.  1515 no response to ammonia capsule.  Or to painful stimulis.. Dr.ll Hewitt notified of respiratory issues.  Transfer ordered for step down unit per [facility] and Dr. Angie Fava.

## 2018-09-15 NOTE — Progress Notes (Deleted)
eLink Physician-Brief Progress Note Patient Name: Burech Marchesano DOB: 1966/01/04 MRN: [id number]   Date of Service  09/30/2018  HPI/Events of Note  Nursing requesting restraints for safety for ventilated patient  eICU Interventions  Restraint order entered        Migdalia Dk 09/30/2018, 7:41 PM

## 2018-09-15 NOTE — H&P (Signed)
Rodney Hoffman is an 53 y.o. male.   Chief Complaint: left ankle pain HPI:  The patient is a 53 y/o male with  PMH of smoking and diabetes.  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  He has stopped smoking, and his A1C is below 7.  He presents now for operative treatment of this painful left ankle injury.  Past Medical History:  Diagnosis Date  . Diabetes mellitus without complication (HCC)   . Hypertension   . Sleep apnea    does not wear CPAP    Past Surgical History:  Procedure Laterality Date  . APPENDECTOMY  1979    History reviewed. No pertinent family history. Social History:  reports that he quit smoking about 7 weeks ago. His smoking use included cigarettes. He has never used smokeless tobacco. He reports previous alcohol use. He reports that he does not use drugs.  Allergies: No Known Allergies  Medications Prior to Admission  Medication Sig Dispense Refill  . glipiZIDE (GLUCOTROL) 5 MG tablet Take by mouth 2 (two) times daily before a meal.    . lisinopril (PRINIVIL,ZESTRIL) 10 MG tablet Take 10 mg by mouth daily.    . metFORMIN (GLUCOPHAGE) 500 MG tablet Take by mouth 2 (two) times daily with a meal.      Results for orders placed or performed during the hospital encounter of 09-30-18 (from the past 48 hour(s))  Basic metabolic panel     Status: Abnormal   Collection Time: 09/14/18 12:00 PM  Result Value Ref Range   Sodium 141 135 - 145 mmol/L   Potassium 4.5 3.5 - 5.1 mmol/L   Chloride 103 98 - 111 mmol/L   CO2 30 22 - 32 mmol/L   Glucose, Bld 98 70 - 99 mg/dL   BUN 12 6 - 20 mg/dL   Creatinine, Ser 5.39 0.61 - 1.24 mg/dL   Calcium 8.8 (L) 8.9 - 10.3 mg/dL   GFR calc non Af Amer >60 >60 mL/min   GFR calc Af Amer >60 >60 mL/min   Anion gap 8 5 - 15    Comment: Performed at [facility], [address], [place], [place] [address]  Glucose,  capillary     Status: Abnormal   Collection Time: Sep 30, 2018  9:53 AM  Result Value Ref Range   Glucose-Capillary 104 (H) 70 - 99 mg/dL   No results found.  ROS  No recent f/c/n/v/wt loss.  Blood pressure (!) 144/99, pulse 87, temperature 98.3 F (36.8 C), temperature source Oral, resp. rate 18, height 6\' 1"  (1.854 m), weight (!) 151.1 kg, SpO2 99 %. Physical Exam  wn wd male in nad.  A and O x 4.  Mood and affect normal.  EOMI.  resp unlabored. L ankle with valgus malalignment.  NTTP.  Skin healthy and intact.  No lymphadenopathy.  5/5 strength in PF and DF of the ankle and toes.  Sens to LT intact dorsally and plantarly at the forefoot.  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  The risks and benefits of the alternative treatment options have been discussed in detail.  The patient wishes to proceed with surgery and specifically understands risks of bleeding, infection, nerve damage, blood clots, need for additional surgery, amputation and death.  Toni ArthursJohn Rawan Riendeau, MD Aug 19, 2019, 11:20 AM

## 2018-09-15 NOTE — Progress Notes (Signed)
eLink Physician-Brief Progress Note Patient Name: Atiksh Gandee DOB: Jan 01, 1966 MRN: [id number]   Date of Service  09/23/18  HPI/Events of Note  Needs sliding scale Novalog insulin order.  eICU Interventions  Insulin order entered.        Migdalia Dk 09/23/2018, 9:33 PM

## 2018-09-15 NOTE — Progress Notes (Signed)
Assisted Dr. Oddono with left, ultrasound guided, popliteal/saphenous block. Side rails up, monitors on throughout procedure. See vital signs in flow sheet. Tolerated Procedure well. 

## 2018-09-15 NOTE — Op Note (Addendum)
2019-03-06  2:04 PM  PATIENT:  Rodney Hoffman  53 y.o. male  PRE-OPERATIVE DIAGNOSIS: 1.  Left fibula fracture nonunion 2.  Left tibia fracture nonunion 3.  History of left ankle displaced bimalleolar fracture  POST-OPERATIVE DIAGNOSIS: Same  Procedure(s): 1.  Open treatment of left fibular nonunion with internal fixation 2.  Open treatment of left tibia (medial malleolus) nonunion with internal fixation 3.  Open treatment of syndesmosis disruption with internal fixation 4.  AP, mortise and lateral radiographs of the left ankle.  SURGEON:  Toni ArthursJohn Sherrina Zaugg, MD  ASSISTANT: Alfredo MartinezJustin Ollis, PA-C  ANESTHESIA:   General, regional  EBL:  minimal   TOURNIQUET:   Total Tourniquet Time Documented: Thigh (Left) - 103 minutes Total: Thigh (Left) - 103 minutes  COMPLICATIONS:  None apparent  DISPOSITION:  Extubated, awake and stable to recovery.  INDICATION FOR PROCEDURE: The patient is a 53 year old male who injured his left ankle at work almost a year ago.  He suffered a displaced bimalleolar fracture and was initially treated at an outside hospital.  He was substantially delayed until presenting for treatment at my office.  By that time he had developed nonunions of his tibia and fibula.  His hemoglobin A1c is finally down below 7, and he has quit smoking.  He presents now for operative treatment of these painful left ankle nonunions.  The risks and benefits of the alternative treatment options have been discussed in detail.  The patient wishes to proceed with surgery and specifically understands risks of bleeding, infection, nerve damage, blood clots, need for additional surgery, amputation and death.  PROCEDURE IN DETAIL:  After pre operative consent was obtained, and the correct operative site was identified, the patient was brought to the operating room and placed supine on the OR table.  Anesthesia was administered.  Pre-operative antibiotics were administered.  A surgical timeout was taken.   The left lower extremity was prepped and draped in standard sterile fashion with a tourniquet around the thigh.  The extremity was elevated and the tourniquet was inflated to 350 mmHg.  A longitudinal incision was then made over the lateral malleolus.  Dissection was carried down through the skin and subcutaneous tissues.  The nonunion site was identified.  The nonunion was cleaned of all fibrous tissue and the fracture fragments were mobilized.  The fracture was pulled out to length and provisionally held with a tenaculum.  Radiographs showed appropriate reduction of the fracture.  At this point attention was turned to the medial malleolus where a longitudinal incision was made.  Dissection was carried down through the skin and subcutaneous tissues.  The fracture site was identified.  The nonunion was cleared of all fibrous tissue.  The fracture fragments were mobilized.  The distal fragment was retracted exposing the medial gutter.  It was extensively scarred.  Scar was removed with a curette and rondure.  The fracture was then freshened on either side by perforating the bone with a K wire.  After the wound was irrigated the fracture was reduced.  The fracture was then provisionally pinned.  AP, lateral and mortise radiographs confirmed appropriate reduction and position of the K wires.  4 mm cannulated screws from the [employer] small frag set were then inserted over the K wires.  Both were noted to have excellent purchase and compressed the fracture site appropriately.  Attention was then returned to the lateral malleolus.  The wound was irrigated.  Both sides of the fracture were perforated with a K wire.  The  fracture was reduced and held with a tenaculum.  A 3.5 mm fully threaded lag screw was inserted from posterior to anterior across the nonunion site.  An 8 hole one third tubular plate was then contoured to fit the lateral malleolus.  It was secured distally with 3 unicortical screws and proximally with a  bicortical screw.  Radiographs then were obtained showing persistent widening at the syndesmosis.  The syndesmosis was reduced.  2 screws were placed across the syndesmosis across all 4 cortices of the distal fibula and tibia.  Final AP, mortise and lateral radiographs confirmed appropriate reduction of the fracture in appropriate position and length of all hardware.  Both wounds were then irrigated copiously.  Vancomycin powder was placed in the deep layer of both wounds.  Subcutaneous tissues were approximated with 2-0 Vicryl.  Skin incisions were closed with staples.  Sterile dressings were applied followed by a well-padded short leg splint.  The tourniquet was released after application of the dressings.  The patient was awakened from anesthesia and transported to the recovery room in stable condition.   FOLLOW UP PLAN: Nonweightbearing on the left lower extremity.  Observation overnight due to a history of sleep apnea.  Follow-up in 2 weeks for suture removal and conversion to a short leg cast.  Nonweightbearing for 6 weeks postop.  Aspirin 81 mg p.o. twice daily for DVT prophylaxis.   RADIOGRAPHS: AP, mortise and lateral radiographs of the left ankle are obtained intraoperatively.  These show interval reduction and fixation of the tibial and fibular nonunions.  Hardware is appropriately positioned and of the appropriate lengths.    Alfredo Martinez PA-C was present and scrubbed for the duration of the operative case. His assistance was essential in positioning the patient, prepping and draping, gaining and maintaining exposure, performing the operation, closing and dressing the wounds and applying the splint.  Addendum: Upon extubation the patient was noted to have low oxygen saturation.  In consultation with the anesthesia team, the decision was made to transfer the patient to [facility] for inpatient management of his respiratory condition.  He was stable at the time of admission to the inpatient  ward.  The family was updated.  Inpatient admission is medically necessary due to his marginal respiratory status.

## 2018-09-15 NOTE — Anesthesia Postprocedure Evaluation (Signed)
Anesthesia Post Note  Patient: Rodney Hoffman  Procedure(s) Performed: Left fibular and tibial open treatment with internal fixation (Left Ankle)     Patient location during evaluation: PACU Anesthesia Type: General Level of consciousness: awake and alert Pain management: pain level controlled Vital Signs Assessment: post-procedure vital signs reviewed and stable Respiratory status: spontaneous breathing, nonlabored ventilation, respiratory function stable and patient connected to nasal cannula oxygen Cardiovascular status: blood pressure returned to baseline and stable Postop Assessment: no apparent nausea or vomiting Anesthetic complications: no    Last Vitals:  Vitals:   10-01-18 2048 10/01/18 2051  BP:    Pulse:  74  Resp:  (!) 30  Temp:    SpO2: 91% 91%    Last Pain:  Vitals:   10/01/18 1826  TempSrc: Oral  PainSc:                  Rodney Hoffman

## 2018-09-15 NOTE — Anesthesia Preprocedure Evaluation (Addendum)
Anesthesia Evaluation  Patient identified by MRN, date of birth, ID band Patient awake    Reviewed: Allergy & Precautions, H&P , NPO status , Patient's Chart, lab work & pertinent test results, reviewed documented beta blocker date and time   Airway Mallampati: IV  TM Distance: >3 FB Neck ROM: full    Dental no notable dental hx. (+) Teeth Intact   Pulmonary sleep apnea , former smoker,    Pulmonary exam normal breath sounds clear to auscultation       Cardiovascular Exercise Tolerance: Good hypertension, Pt. on medications negative cardio ROS   Rhythm:regular Rate:Normal     Neuro/Psych negative neurological ROS  negative psych ROS   GI/Hepatic negative GI ROS, Neg liver ROS,   Endo/Other  diabetesMorbid obesity  Renal/GU negative Renal ROS  negative genitourinary   Musculoskeletal   Abdominal   Peds  Hematology negative hematology ROS (+)   Anesthesia Other Findings   Reproductive/Obstetrics negative OB ROS                            Anesthesia Physical Anesthesia Plan  ASA: III  Anesthesia Plan: General   Post-op Pain Management: GA combined w/ Regional for post-op pain   Induction:   PONV Risk Score and Plan: 2 and Dexamethasone, Ondansetron, Treatment may vary due to age or medical condition and Midazolam  Airway Management Planned: LMA and Oral ETT  Additional Equipment:   Intra-op Plan:   Post-operative Plan:   Informed Consent: I have reviewed the patients History and Physical, chart, labs and discussed the procedure including the risks, benefits and alternatives for the proposed anesthesia with the patient or authorized representative who has indicated his/her understanding and acceptance.     Dental Advisory Given  Plan Discussed with: CRNA, Anesthesiologist and Surgeon  Anesthesia Plan Comments:         Anesthesia Quick Evaluation

## 2018-09-15 NOTE — Anesthesia Postprocedure Evaluation (Signed)
Anesthesia Post Note  Patient: Rodney Hoffman  Procedure(s) Performed: Left fibular and tibial open treatment with internal fixation (Left Ankle)     Patient location during evaluation: PACU Anesthesia Type: General and Regional Level of consciousness: obtunded/minimal responses Pain management: pain level controlled Vital Signs Assessment: vitals unstable Respiratory status: spontaneous breathing, non-rebreather facemask and patient connected to face mask oxygen Cardiovascular status: stable and tachycardic Postop Assessment: no apparent nausea or vomiting Anesthetic complications: yes Anesthetic complication details: respiratory event and anesthesia complicationsComments: PT slow to emerge for GA LMA. O2 saturation between 88-92% with airway support.  Pt minimally responsive to stimulation,  O2 saturation improves with airway support.  Pt has history of OSA and has not been fitted for CPAP.  No narcotic or Benzo  given intraoperatively,  I suspect CO2 narcosis and will have patient transferred to ICU and discuss with Attendings. EJOddono jr Md    Last Vitals:  Vitals:   10-02-18 2048 10-02-2018 2051  BP:    Pulse:  74  Resp:  (!) 30  Temp:    SpO2: 91% 91%    Last Pain:  Vitals:   10-02-2018 1826  TempSrc: Oral  PainSc:                  Rodney Hoffman

## 2018-09-15 NOTE — Anesthesia Procedure Notes (Addendum)
Anesthesia Regional Block: Popliteal block   Pre-Anesthetic Checklist: ,, timeout performed, Correct Patient, Correct Site, Correct Laterality, Correct Procedure, Correct Position, site marked, Risks and benefits discussed,  Surgical consent,  Pre-op evaluation,  At surgeon's request and post-op pain management  Laterality: Left  Prep: chloraprep       Needles:  Injection technique: Single-shot  Needle Type: Echogenic Stimulator Needle     Needle Length: 5cm  Needle Gauge: 22     Additional Needles:   Procedures:, nerve stimulator,,, ultrasound used (permanent image in chart),,,,  Narrative:  Start time: 14-Oct-2018 10:30 AM End time: 10-14-2018 10:40 AM Injection made incrementally with aspirations every 5 mL.  Performed by: Personally  Anesthesiologist: Bethena Midget, MD  Additional Notes: Functioning IV was confirmed and monitors were applied.  A 83mm 22ga Arrow echogenic stimulator needle was used. Sterile prep and drape,hand hygiene and sterile gloves were used. Ultrasound guidance: relevant anatomy identified, needle position confirmed, local anesthetic spread visualized around nerve(s)., vascular puncture avoided.  Image printed for medical record. Negative aspiration and negative test dose prior to incremental administration of local anesthetic. The patient tolerated the procedure well.

## 2018-09-15 NOTE — Progress Notes (Signed)
Dr. Victorino Dike and Dr. Angie Fava at bedside.   Dr. Victorino Dike notified of admission and hospital room number.  Patient grimaced , moved head and legs equally bilaterally.  and opens eyes.  Suctioned per Dr Kinnie Scales yankeur through oral airway.

## 2018-09-15 NOTE — Progress Notes (Signed)
Called back by bedside RN, patient becoming more hypoxemic and hypotensive.  Patient underwent a recruitment maneuver with some improvement in oxygenation. ABG repeated, improvement in CO2 removal but PaO2 remains very poor.  Will place TLC.  Start levophed.  Bicarb drip and lasix drip started.  PCCM will continue to follow.  The patient is critically ill with multiple organ systems failure and requires high complexity decision making for assessment and support, frequent evaluation and titration of therapies, application of advanced monitoring technologies and extensive interpretation of multiple databases.   Critical Care Time devoted to patient care services described in this note is  35  Minutes. This time reflects time of care of this signee Dr Koren Bound. This critical care time does not reflect procedure time, or teaching time or supervisory time of PA/NP/Med student/Med Resident etc but could involve care discussion time.  Alyson Reedy, M.D. [facility] Pulmonary/Critical Care Medicine. Pager: [phone]. After hours pager: [phone].

## 2018-09-15 NOTE — Transfer of Care (Signed)
Immediate Anesthesia Transfer of Care Note  Patient: Rodney Hoffman  Procedure(s) Performed: Left fibular and tibial open treatment with internal fixation (Left Ankle)  Patient Location: PACU  Anesthesia Type:GA combined with regional for post-op pain  Level of Consciousness: sedated  Airway & Oxygen Therapy: Patient Spontanous Breathing and Patient connected to face mask oxygen  Post-op Assessment: Report given to RN and Post -op Vital signs reviewed and unstable, Anesthesiologist notified  Post vital signs: Reviewed and stable  Last Vitals:  Vitals Value Taken Time  BP 156/99 09/03/2018  2:45 PM  Temp    Pulse 108 08/31/2018  2:46 PM  Resp 31 09/23/2018  2:46 PM  SpO2 77 % 09/29/2018  2:46 PM  Vitals shown include unvalidated device data.  Last Pain:  Vitals:   09/14/2018 0952  TempSrc: Oral  PainSc: 0-No pain         Complications: No apparent anesthesia complications and respiratory complications

## 2018-09-15 NOTE — Anesthesia Procedure Notes (Signed)
Procedure Name: LMA Insertion Date/Time: 10/10/18 11:52 AM Performed by: Burna Cash, CRNA Pre-anesthesia Checklist: Patient identified, Emergency Drugs available, Suction available and Patient being monitored Patient Re-evaluated:Patient Re-evaluated prior to induction Oxygen Delivery Method: Circle system utilized Preoxygenation: Pre-oxygenation with 100% oxygen Induction Type: IV induction Ventilation: Mask ventilation without difficulty LMA: LMA inserted LMA Size: 5.0 Number of attempts: 1 Airway Equipment and Method: Bite block Placement Confirmation: positive ETCO2 Tube secured with: Tape Dental Injury: Teeth and Oropharynx as per pre-operative assessment

## 2018-09-15 NOTE — Progress Notes (Signed)
Dr. Angie Favadono at bedside .  Report called to Eddie Candleavid Tuchman, [facility]

## 2018-09-15 NOTE — Progress Notes (Signed)
Care link in [facility] report given, Patients sat dropped to 82-83 , chin lift and jaw thrust.  Bagged for transfer by carelink staff.
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
## 2018-09-15 NOTE — Procedures (Signed)
Intubation Procedure Note Rodney Hoffman [id number] 12/12/1965  Procedure: Intubation Indications: Respiratory insufficiency  Procedure Details Consent: Unable to obtain consent because of emergent medical necessity. Time Out: Verified patient identification, verified procedure, site/side was marked, verified correct patient position, special equipment/implants available, medications/allergies/relevent history reviewed, required imaging and test results available.  Performed  Maximum sterile technique was used including gloves, hand hygiene and mask.  MAC    Evaluation Hemodynamic Status: BP stable throughout; O2 sats: stable throughout Patient's Current Condition: stable Complications: No apparent complications Patient did tolerate procedure well. Chest X-ray ordered to verify placement.  CXR: pending.   YACOUB,WESAM 09/28/2018

## 2018-09-16 ENCOUNTER — Inpatient Hospital Stay (HOSPITAL_COMMUNITY): Payer: No Typology Code available for payment source

## 2018-09-16 ENCOUNTER — Encounter (HOSPITAL_BASED_OUTPATIENT_CLINIC_OR_DEPARTMENT_OTHER): Payer: Self-pay | Admitting: Orthopedic Surgery

## 2018-09-16 DIAGNOSIS — J9601 Acute respiratory failure with hypoxia: Secondary | ICD-10-CM

## 2018-09-16 LAB — BLOOD GAS, ARTERIAL
Acid-Base Excess: 5.7 mmol/L — ABNORMAL HIGH (ref 0.0–2.0)
Bicarbonate: 28.5 mmol/L — ABNORMAL HIGH (ref 20.0–28.0)
FIO2: 100
MECHVT: 550 mL
O2 Saturation: 99.4 %
PEEP: 16 cmH2O
Patient temperature: 98.6
RATE: 30 resp/min
pCO2 arterial: 32.6 mmHg (ref 32.0–48.0)
pH, Arterial: 7.55 — ABNORMAL HIGH (ref 7.350–7.450)
pO2, Arterial: 154 mmHg — ABNORMAL HIGH (ref 83.0–108.0)

## 2018-09-16 LAB — CBC
HCT: 48.4 % (ref 39.0–52.0)
Hemoglobin: 14.6 g/dL (ref 13.0–17.0)
MCH: 28.7 pg (ref 26.0–34.0)
MCHC: 30.2 g/dL (ref 30.0–36.0)
MCV: 95.3 fL (ref 80.0–100.0)
Platelets: 203 10*3/uL (ref 150–400)
RBC: 5.08 MIL/uL (ref 4.22–5.81)
RDW: 14.3 % (ref 11.5–15.5)
WBC: 8.7 10*3/uL (ref 4.0–10.5)
nRBC: 0 % (ref 0.0–0.2)

## 2018-09-16 LAB — BASIC METABOLIC PANEL
Anion gap: 11 (ref 5–15)
BUN: 21 mg/dL — ABNORMAL HIGH (ref 6–20)
CO2: 27 mmol/L (ref 22–32)
Calcium: 7.6 mg/dL — ABNORMAL LOW (ref 8.9–10.3)
Chloride: 100 mmol/L (ref 98–111)
Creatinine, Ser: 2.24 mg/dL — ABNORMAL HIGH (ref 0.61–1.24)
GFR calc Af Amer: 37 mL/min — ABNORMAL LOW (ref >60)
GFR calc non Af Amer: 32 mL/min — ABNORMAL LOW (ref >60)
GLUCOSE: 160 mg/dL — AB (ref 70–99)
Potassium: 3.8 mmol/L (ref 3.5–5.1)
Sodium: 138 mmol/L (ref 135–145)

## 2018-09-16 LAB — POCT I-STAT 3, ART BLOOD GAS (G3+)
Acid-Base Excess: 10 mmol/L — ABNORMAL HIGH (ref 0.0–2.0)
Bicarbonate: 33.6 mmol/L — ABNORMAL HIGH (ref 20.0–28.0)
O2 Saturation: 91 %
PH ART: 7.563 — AB (ref 7.350–7.450)
PO2 ART: 51 mmHg — AB (ref 83.0–108.0)
Patient temperature: 97.6
TCO2: 35 mmol/L — ABNORMAL HIGH (ref 22–32)
pCO2 arterial: 37 mmHg (ref 32.0–48.0)

## 2018-09-16 LAB — ECHOCARDIOGRAM COMPLETE
Height: 73 in
WEIGHTICAEL: 5407.44 [oz_av]

## 2018-09-16 LAB — GLUCOSE, CAPILLARY
GLUCOSE-CAPILLARY: 94 mg/dL (ref 70–99)
Glucose-Capillary: 129 mg/dL — ABNORMAL HIGH (ref 70–99)
Glucose-Capillary: 143 mg/dL — ABNORMAL HIGH (ref 70–99)
Glucose-Capillary: 161 mg/dL — ABNORMAL HIGH (ref 70–99)
Glucose-Capillary: 163 mg/dL — ABNORMAL HIGH (ref 70–99)
Glucose-Capillary: 86 mg/dL (ref 70–99)
Glucose-Capillary: 93 mg/dL (ref 70–99)

## 2018-09-16 LAB — MAGNESIUM
Magnesium: 1.7 mg/dL (ref 1.7–2.4)
Magnesium: 0.8 mg/dL — CL (ref 1.7–2.4)
Magnesium: 1.7 mg/dL (ref 1.7–2.4)

## 2018-09-16 LAB — PHOSPHORUS
PHOSPHORUS: 1.9 mg/dL — AB (ref 2.5–4.6)
Phosphorus: 1.5 mg/dL — ABNORMAL LOW (ref 2.5–4.6)
Phosphorus: 4.3 mg/dL (ref 2.5–4.6)

## 2018-09-16 LAB — HIV ANTIBODY (ROUTINE TESTING W REFLEX): HIV Screen 4th Generation wRfx: NONREACTIVE

## 2018-09-16 MED ORDER — PRO-STAT SUGAR FREE PO LIQD
30.0000 mL | Freq: Two times a day (BID) | ORAL | Status: DC
  Administered 2018-09-16: 30 mL
  Filled 2018-09-16: qty 30

## 2018-09-16 MED ORDER — FUROSEMIDE 10 MG/ML IJ SOLN
40.0000 mg | Freq: Once | INTRAMUSCULAR | Status: AC
  Administered 2018-09-16: 40 mg via INTRAVENOUS
  Filled 2018-09-16: qty 4

## 2018-09-16 MED ORDER — VITAL HIGH PROTEIN PO LIQD
1000.0000 mL | ORAL | Status: DC
  Administered 2018-09-17 – 2018-09-19 (×2): 1000 mL

## 2018-09-16 MED ORDER — INSULIN ASPART 100 UNIT/ML ~~LOC~~ SOLN
0.0000 [IU] | SUBCUTANEOUS | Status: DC
  Administered 2018-09-17 – 2018-09-27 (×25): 3 [IU] via SUBCUTANEOUS
  Administered 2018-09-27: 4 [IU] via SUBCUTANEOUS
  Administered 2018-09-27: 3 [IU] via SUBCUTANEOUS
  Administered 2018-09-28 (×4): 4 [IU] via SUBCUTANEOUS
  Administered 2018-09-28 (×2): 3 [IU] via SUBCUTANEOUS
  Administered 2018-09-28: 4 [IU] via SUBCUTANEOUS
  Administered 2018-09-29 – 2018-10-02 (×8): 3 [IU] via SUBCUTANEOUS

## 2018-09-16 MED ORDER — VITAL HIGH PROTEIN PO LIQD
1000.0000 mL | ORAL | Status: DC
  Administered 2018-09-16: 1000 mL

## 2018-09-16 MED ORDER — CHLORHEXIDINE GLUCONATE 0.12% ORAL RINSE (MEDLINE KIT)
15.0000 mL | Freq: Two times a day (BID) | OROMUCOSAL | Status: DC
  Administered 2018-09-16 – 2018-10-03 (×34): 15 mL via OROMUCOSAL

## 2018-09-16 MED ORDER — PERFLUTREN LIPID MICROSPHERE
1.0000 mL | INTRAVENOUS | Status: AC | PRN
  Administered 2018-09-16: 4 mL via INTRAVENOUS
  Filled 2018-09-16: qty 10

## 2018-09-16 MED ORDER — HEPARIN SODIUM (PORCINE) 5000 UNIT/ML IJ SOLN
5000.0000 [IU] | Freq: Three times a day (TID) | INTRAMUSCULAR | Status: DC
  Administered 2018-09-16 – 2018-10-03 (×52): 5000 [IU] via SUBCUTANEOUS
  Filled 2018-09-16 (×52): qty 1

## 2018-09-16 MED ORDER — SODIUM PHOSPHATES 45 MMOLE/15ML IV SOLN
20.0000 mmol | Freq: Once | INTRAVENOUS | Status: AC
  Administered 2018-09-16: 20 mmol via INTRAVENOUS
  Filled 2018-09-16: qty 6.67

## 2018-09-16 MED ORDER — ORAL CARE MOUTH RINSE
15.0000 mL | OROMUCOSAL | Status: DC
  Administered 2018-09-16 – 2018-10-03 (×173): 15 mL via OROMUCOSAL

## 2018-09-16 MED ORDER — PRO-STAT SUGAR FREE PO LIQD
60.0000 mL | Freq: Every day | ORAL | Status: DC
  Administered 2018-09-16 – 2018-09-19 (×16): 60 mL
  Filled 2018-09-16 (×16): qty 60
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
## 2018-09-16 NOTE — Progress Notes (Signed)
Vent changes made by Dr. Craige Cotta.  ABG to follow.

## 2018-09-16 NOTE — Progress Notes (Signed)
eLink Physician-Brief Progress Note Patient Name: Rodney Hoffman DOB: October 31, 1965 MRN: [id number]   Date of Service  09/16/2018  HPI/Events of Note  Metabolic alkalosis on sodium bicarbonate infusion exacerbated by mild respiratory alkalosis  eICU Interventions  Discontinue sodium bicarbonate infusion        Migdalia Dk 09/16/2018, 5:31 AM

## 2018-09-16 NOTE — Progress Notes (Signed)
Subjective: 1 Day Post-Op Procedure(s) (LRB): Left fibular and tibial open treatment with internal fixation (Left)  Patient is intubated and on paralytics following post-operative acute respiratory failure.    Objective:   VITALS:  Temp:  [97.7 F (36.5 C)-98.3 F (36.8 C)] 97.8 F (36.6 C) (01/16 2000) Pulse Rate:  [40-146] 66 (01/17 0630) Resp:  [10-39] 30 (01/17 0630) BP: (61-191)/(31-137) 119/73 (01/17 0530) SpO2:  [65 %-100 %] 96 % (01/17 0630) Arterial Line BP: (58-202)/(42-100) 111/58 (01/17 0630) FiO2 (%):  [80 %-100 %] 80 % (01/17 0415) Weight:  [151.1 kg-153.3 kg] 153.3 kg (01/17 0500)  General: WDWN patient  HEENT: unable to assess Chest:  Mechanical vent regulated respirations Neuro: unable to assess Skin:  Splint CDI. Extremities: warm/dry, mild edema, no visible erythema or echymosis.  No lymphadenopathy. Pulses: Popliteus 2+ MSK:  Unable to assess    LABS Recent Labs    09/27/2018 1655 09/16/18 0334  HGB 16.4 14.6  WBC 12.7* 8.7  PLT 289 203   Recent Labs    09/07/2018 1655 09/16/18 0334  NA 139 138  K 5.1 3.8  CL 99 100  CO2 31 27  BUN 15 21*  CREATININE 1.69* 2.24*  GLUCOSE 242* 160*   No results for input(s): LABPT, INR in the last 72 hours.   Assessment/Plan: 1 Day Post-Op Procedure(s) (LRB): Left fibular and tibial open treatment with internal fixation (Left) Complicated by Acute respiratory failure   - Medical management by Critical Care team.  Your support is greatly appreciated - NWB L LE - Reinforce splint as needed. - Will plan to initiate PT/OT after patient becomes stable and transferred to step down unit - Disposition: pending - Please call with any questions/concerns  Alfredo Martinez PA-C [facility] Office:  [phone]

## 2018-09-16 NOTE — Progress Notes (Addendum)
Initial Nutrition Assessment  DOCUMENTATION CODES:   Morbid obesity  INTERVENTION:  - If medically feasible, recommend initiation of TF in 24-48 hours. - Goal rate for TF: Vital High Protein @ 70 ml/hr with 60 ml Prostat BID. This regimen will provide 2080 kcal, 207 grams of protein, and 1404 ml free water.   NUTRITION DIAGNOSIS:   Inadequate oral intake related to inability to eat as evidenced by NPO status.  GOAL:   Provide needs based on ASPEN/SCCM guidelines  MONITOR:   Vent status, Weight trends, Labs, Skin, I & O's  REASON FOR ASSESSMENT:   Ventilator  ASSESSMENT:   53 y.o. male admitted with PMH of smoking and DM. 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 He has stopped smoking, and his A1C is below 7. He presented to [facility] for operative treatment of painful L ankle injury.  No family/visitors present at bedside. Patient intubated with OGT in place and ~350 ml hunter green output in canister. He is POD #1 ORIF of L ankle. No family/visitors present at bedside.  Dr. Percival Spanish note from yesterday evening indicates that patient was extubated immediately after surgery but then developed hypoxemia, sats in the low 80s, and AMS which progressively worsened. He was subsequently re-intubated for airway protection.   Per chart review, no weight PTA. Pre-op weight of 144.8 kg used in estimating kcal need (current weight is 153.3 kg).    Patient is currently intubated on ventilator support MV: 13.4 L/min Temp (24hrs), Avg:97.8 F (36.6 C), Min:97.6 F (36.4 C), Max:97.9 F (36.6 C) Propofol: 58.9 ml/hr (1555 kcal) BP: 114/69 and MAP: 83   Medications reviewed; 100 mg colace per OGT BID, 120 mg IV lasix x1 dose 1/16, sliding scale novolog, 40 mg IV protonix/day, 1 tablet senokot BID, 20 mmol IV NaPhos x1 run 1/17. Labs reviewed; CBGs: 86-161 mg/dl  today, BUN: 21 mg/dl, creatinine: 8.56 mg/dl, Ca: 7.6 mg/dl, Phos: 1.9 mg/dl, GFR: 37 ml/min. IVF; NS @ 75 ml/hr. Drips; propofol @ 65 mcg/kg/min, nimbex @ 1.5 mcg/kg/min, fentanyl @ 400 mcg/hr, levo @ 0.5 mcg/min, lasix @ 5 mg/hr.       NUTRITION - FOCUSED PHYSICAL EXAM:  Completed; no muscle and no fat wasting, mild edema to BLE.  Diet Order:   Diet Order            Diet NPO time specified  Diet effective now        Diet - low sodium heart healthy              EDUCATION NEEDS:   Not appropriate for education at this time  Skin:  Skin Assessment: Skin Integrity Issues: Skin Integrity Issues:: Incisions Incisions: L ankle (09/26/2018)  Last BM:  PTA/unknown  Height:   Ht Readings from Last 1 Encounters:  09/07/2018 6\' 1"  (1.854 m)    Weight:   Wt Readings from Last 1 Encounters:  09/16/18 (!) 153.3 kg    Ideal Body Weight:  83.64 kg  BMI:  Body mass index is 44.59 kg/m.  Estimated Nutritional Needs:   Kcal:  3149-7026 kcal (11-14 kcal/kg actual weight)  Protein:  >/= 209 grams (2.5 grams/kg IBW)  Fluid:  >/= 2 L/day     Trenton Gammon, MS, RD, LDN, [facility] Inpatient Clinical Dietitian Pager # [phone] After hours/weekend pager # [phone]

## 2018-09-16 NOTE — Progress Notes (Signed)
NAME:  Rodney Hoffman, MRN:  [id number], DOB:  16-Dec-1965, LOS: 1 ADMISSION DATE:  09/28/2018, CONSULTATION DATE:  1/16 REFERRING MD:  Dr. Victorino Dike, CHIEF COMPLAINT:  Post op respiratory failure  Brief History   This is a 53 year old male with a history of HTN, OSA and DM who after getting a ORIF of his left fibular and tibial developed hypoxic and hypercarbic respiratory failure requiring intubation and transfer to [facility] and the ICU for airway management.  History of present illness   53 year old male with past medical history as below, which is significant for tobacco abuse and diabetes.  The patient is encephalopathic and therefore history is obtained largely from chart review.  He suffered a left ankle fracture about a year ago and after some lifestyle modification and better management of his diabetes he presented on 1/16 to the outpatient surgery center for left ankle ORIF under Dr. Victorino Dike.  He was extubated immediately postoperatively, however, he then developed hypoxemia with sats down into the low 80s and altered mental status progressing to obtundation.  He was transferred to [facility] and [facility] for airway management.  He was given 120 mg IV lasix and had poor output, he became profoundly hypotensive. Bedside u/s showed dilated RV and hyperdynamic LV. Cardiology called and patient was started on levophed drip. He initially had respiratory acidosis and was started on bicarbonate drip, follow up ABG showed respiratory alkalosis so bicarbonate drip was stopped.   Past Medical History   Past Medical History:  Diagnosis Date  . Diabetes mellitus without complication (HCC)   . Hypertension   . Sleep apnea    does not wear CPAP     Significant Hospital Events   1/16 ORIF L ankle. Failed extubation. Transfer to [facility]  Consults:    Procedures:  1/16 L ankle ORIF (Dr. Victorino Dike) 1/16 ETT  Significant Diagnostic Tests:   Echocardiogram 09/16/18 Impressions:  - Normal LV size  with moderate LV hypertrophy. EF 50-55%, low   normal to mildly decreased. Basal inferior akinesis. Mildly   dilated RV with mild to moderately decreased systolic function.   Dilated IVC suggestive of elevated RV filling pressure. Micro Data:    Antimicrobials:     Interim history/subjective:  Patient is currently intubated, no family at bedside.  He has been having okay output, about 100 cc/hr. Developed metabolic alkalosis on sodium bicarbonate infusion and the infusion was stopped.   Objective   Blood pressure 117/74, pulse 69, temperature 97.8 F (36.6 C), temperature source Axillary, resp. rate (!) 30, height 6\' 1"  (1.854 m), weight (!) 153.3 kg, SpO2 94 %.    Vent Mode: PRVC FiO2 (%):  [100 %] 100 % Set Rate:  [26 bmp-30 bmp] 30 bmp Vt Set:  [550 mL] 550 mL PEEP:  [12 cmH20-16 cmH20] 16 cmH20 Plateau Pressure:  [31 cmH20-42 cmH20] 31 cmH20   Intake/Output Summary (Last 24 hours) at 09/16/2018 0626 Last data filed at 09/16/2018 0500 Gross per 24 hour  Intake 3879.05 ml  Output 1500 ml  Net 2379.05 ml   Filed Weights   09/14/18 1142 09/18/2018 0952 09/16/18 0500  Weight: (!) 144.8 kg (!) 151.1 kg (!) 153.3 kg    Examination: General: Intubated, left central line and left arterial line in place, obese male HENT: Normocephalic, atraumatic, pinpoint pupils Lungs: Minimal bilateral crackles, no increased work of breathing  Cardiovascular: RRR, no m/r/g Abdomen: soft, no guarding Extremities: Left leg in splint, trace edema Neuro: Sedated GU: Foley in place  Resolved Hospital Problem list     Assessment & Plan:   Acute hypoxic respiratory failure:  Currently intubated: Respiratory acidosis: Patient was noted to be hypoxic following extubation from the ORIF surgery requiring re-intubation. He had developed respiratory acidosis and was placed on a high TV and RR on the ventilator, he was also started on a bicarbonate drip. During intubation Dr. Molli Knock noted a large  amount of pulmonary edema. Patient is currently intubated. CXR showed improvement in his pulmonary edema, he still has some signs of fluid overload on exam with some bilateral crackles. Given his profound hypoxia yesterday and requirement for paralyzation I do not think that we should attempt to extubate today.  -Pulmonary hygiene  -CXR showed improvement in his pulmonary edema.  -S/p 120 mg IV lasix -Discontinue Lasix drip -Give Lasix 40 mg IV this afternoon and evaluate output -Daily SBT, daily sedation vacation -Echocardiogram > EF 55-60%, basal inferior akinesis, dilated IVC -Discontinue cisatracurium  -Continue fentanyl and propofol drip for sedation -Start tube feeds  AKI: Cr increased to 2.24 from 1.6 yesterday. He did develop profound hypoxia yesterday requiring intubation and this is likely related. He also may have some component of pre-renal azotemia 2/2 his diuresis. We will continue to monitor.  -BMP in AM  Left ankle injury -S/p ORIF on 1/16 by Dr. Victorino Dike -Per ortho >> reinforce splint as needed, will need PT/OT as he becomes stable  DM: -SSI -Frequent CBGs  HTN: -Patient is on lisinopril at home, BP today was on the softer side and he had required a levoped drip.  -Hold Lisinopril -Can restart as necessary   Best practice:  Diet: NPO Pain/Anxiety/Delirium protocol (if indicated): Propofol for RASS -1 to -2 VAP protocol (if indicated): Yes DVT prophylaxis: Heparin GI prophylaxis: PPI Glucose control: SSI Mobility: Bed ridden Code Status: Full Family Communication:  Disposition: Remain in ICU  Labs   CBC: Recent Labs  Lab 09/08/2018 1655 09/16/18 0334  WBC 12.7* 8.7  NEUTROABS 10.9*  --   HGB 16.4 14.6  HCT 59.1* 48.4  MCV 101.4* 95.3  PLT 289 203    Basic Metabolic Panel: Recent Labs  Lab 09/14/18 1200 09/03/2018 1655 09/16/18 0334  NA 141 139 138  K 4.5 5.1 3.8  CL 103 99 100  CO2 30 31 27   GLUCOSE 98 242* 160*  BUN 12 15 21*  CREATININE  1.20 1.69* 2.24*  CALCIUM 8.8* 8.0* 7.6*  MG  --  2.1 1.7  PHOS  --  7.7* 1.9*   GFR: Estimated Creatinine Clearance: 58.3 mL/min (A) (by C-G formula based on SCr of 2.24 mg/dL (H)). Recent Labs  Lab 09/17/2018 1655 09/17/2018 1715 09/29/2018 2200 09/16/18 0334  WBC 12.7*  --   --  8.7  LATICACIDVEN  --  1.3 2.0*  --     Liver Function Tests: Recent Labs  Lab 09/21/2018 1655  AST 19  ALT 14  ALKPHOS 78  BILITOT 0.7  PROT 7.1  ALBUMIN 4.0   No results for input(s): LIPASE, AMYLASE in the last 168 hours. No results for input(s): AMMONIA in the last 168 hours.  ABG    Component Value Date/Time   PHART 7.550 (H) 09/16/2018 0332   PCO2ART 32.6 09/16/2018 0332   PO2ART 154 (H) 09/16/2018 0332   HCO3 28.5 (H) 09/16/2018 0332   TCO2 24 02/02/2010 2356   O2SAT 99.4 09/16/2018 0332     Coagulation Profile: No results for input(s): INR, PROTIME in the last 168 hours.  Cardiac Enzymes: Recent Labs  Lab 08-08-19 1655  TROPONINI <0.03    HbA1C: No results found for: HGBA1C  CBG: Recent Labs  Lab 08-08-19 1441 08-08-19 1958 08-08-19 2205 09/16/18 0025 09/16/18 0343  GLUCAP 143* 168* 189* 161* 143*    Review of Systems:   Currently intubated and sedated  Past Medical History  He,  has a past medical history of Diabetes mellitus without complication (HCC), Hypertension, and Sleep apnea.   Surgical History    Past Surgical History:  Procedure Laterality Date  . APPENDECTOMY  1979     Social History   reports that he quit smoking about 8 weeks ago. His smoking use included cigarettes. He has never used smokeless tobacco. He reports previous alcohol use. He reports that he does not use drugs.   Family History   His family history is not on file.   Allergies No Known Allergies   Home Medications  Prior to Admission medications   Medication Sig Start Date End Date Taking? Authorizing Provider  glipiZIDE (GLUCOTROL) 5 MG tablet Take by mouth 2 (two) times  daily before a meal.   Yes [provider]  lisinopril (PRINIVIL,ZESTRIL) 10 MG tablet Take 10 mg by mouth daily.   Yes [provider]  metFORMIN (GLUCOPHAGE) 500 MG tablet Take by mouth 2 (two) times daily with a meal.   Yes [provider]  aspirin EC 81 MG tablet Take 1 tablet (81 mg total) by mouth 2 (two) times daily. 05/02/2019   Jacinta Shoellis, Justin Pike, PA-C  docusate sodium (COLACE) 100 MG capsule Take 1 capsule (100 mg total) by mouth 2 (two) times daily. While taking narcotic pain medicine. 05/02/2019   Jacinta Shoellis, Justin Pike, PA-C  oxyCODONE (ROXICODONE) 5 MG immediate release tablet Take 1 tablet (5 mg total) by mouth every 4 (four) hours as needed for up to 5 days for severe pain. 05/02/2019 09/20/18  Jacinta Shoellis, Justin Pike, PA-C  senna (SENOKOT) 8.6 MG TABS tablet Take 2 tablets (17.2 mg total) by mouth 2 (two) times daily. 05/02/2019   Jacinta Shoellis, Justin Pike, PA-C     Critical care time:     Claudean SeveranceMarissa M Marcel Gary, M.D. PGY1 Pager [phone] 09/16/2018 6:38 AM

## 2018-09-16 NOTE — Progress Notes (Signed)
NUTRITION NOTE  Patient seen for full assessment earlier today. Consult for TF initiation and management received. Will order Vital High Protein @ 10 ml/hr with 60 ml Prostat x6/day (12 packets/day). This regimen + kcal from current Propofol rate will provide 2995 kcal (148% estimated kcal need), 201 grams of protein (96% estimated protein need), and 201 ml free water.    Trenton Gammon, MS, RD, LDN, [facility] Inpatient Clinical Dietitian Pager # [phone] After hours/weekend pager # [phone]

## 2018-09-17 ENCOUNTER — Inpatient Hospital Stay (HOSPITAL_COMMUNITY): Payer: No Typology Code available for payment source

## 2018-09-17 DIAGNOSIS — J9621 Acute and chronic respiratory failure with hypoxia: Secondary | ICD-10-CM

## 2018-09-17 LAB — BLOOD GAS, ARTERIAL
Acid-Base Excess: 5.7 mmol/L — ABNORMAL HIGH (ref 0.0–2.0)
Bicarbonate: 30.1 mmol/L — ABNORMAL HIGH (ref 20.0–28.0)
DRAWN BY: 252031
FIO2: 60
MECHVT: 550 mL
O2 Saturation: 92.9 %
PEEP: 14 cmH2O
Patient temperature: 98.6
RATE: 20 resp/min
pCO2 arterial: 47.5 mmHg (ref 32.0–48.0)
pH, Arterial: 7.418 (ref 7.350–7.450)
pO2, Arterial: 67.8 mmHg — ABNORMAL LOW (ref 83.0–108.0)

## 2018-09-17 LAB — BASIC METABOLIC PANEL WITH GFR
Anion gap: 11 (ref 5–15)
BUN: 31 mg/dL — ABNORMAL HIGH (ref 6–20)
CO2: 28 mmol/L (ref 22–32)
Calcium: 7.6 mg/dL — ABNORMAL LOW (ref 8.9–10.3)
Chloride: 102 mmol/L (ref 98–111)
Creatinine, Ser: 2.94 mg/dL — ABNORMAL HIGH (ref 0.61–1.24)
GFR calc Af Amer: 27 mL/min — ABNORMAL LOW (ref >60)
GFR calc non Af Amer: 23 mL/min — ABNORMAL LOW (ref >60)
Glucose, Bld: 137 mg/dL — ABNORMAL HIGH (ref 70–99)
Potassium: 3.5 mmol/L (ref 3.5–5.1)
Sodium: 141 mmol/L (ref 135–145)

## 2018-09-17 LAB — GLUCOSE, CAPILLARY
GLUCOSE-CAPILLARY: 150 mg/dL — AB (ref 70–99)
Glucose-Capillary: 136 mg/dL — ABNORMAL HIGH (ref 70–99)
Glucose-Capillary: 144 mg/dL — ABNORMAL HIGH (ref 70–99)
Glucose-Capillary: 150 mg/dL — ABNORMAL HIGH (ref 70–99)
Glucose-Capillary: 105 mg/dL — ABNORMAL HIGH (ref 70–99)
Glucose-Capillary: 130 mg/dL — ABNORMAL HIGH (ref 70–99)
Glucose-Capillary: 123 mg/dL — ABNORMAL HIGH (ref 70–99)

## 2018-09-17 LAB — PHOSPHORUS
PHOSPHORUS: 4.4 mg/dL (ref 2.5–4.6)
Phosphorus: 5.6 mg/dL — ABNORMAL HIGH (ref 2.5–4.6)

## 2018-09-17 LAB — CORTISOL: Cortisol, Plasma: 3.4 ug/dL

## 2018-09-17 LAB — MAGNESIUM
Magnesium: 1.9 mg/dL (ref 1.7–2.4)
Magnesium: 2 mg/dL (ref 1.7–2.4)

## 2018-09-17 LAB — PROCALCITONIN: Procalcitonin: 13.59 ng/mL

## 2018-09-17 MED ORDER — POTASSIUM CHLORIDE 20 MEQ/15ML (10%) PO SOLN
40.0000 meq | Freq: Once | ORAL | Status: AC
  Administered 2018-09-17: 40 meq
  Filled 2018-09-17: qty 30

## 2018-09-17 MED ORDER — FENTANYL CITRATE (PF) 100 MCG/2ML IJ SOLN
50.0000 ug | INTRAMUSCULAR | Status: DC | PRN
  Administered 2018-09-17: 50 ug via INTRAVENOUS
  Administered 2018-09-18 – 2018-10-01 (×22): 100 ug via INTRAVENOUS
  Filled 2018-09-17 (×8): qty 2

## 2018-09-17 MED ORDER — DOCUSATE SODIUM 50 MG/5ML PO LIQD
100.0000 mg | Freq: Two times a day (BID) | ORAL | Status: DC
  Administered 2018-09-17 – 2018-09-23 (×13): 100 mg
  Filled 2018-09-17 (×13): qty 10

## 2018-09-17 MED ORDER — SENNOSIDES 8.8 MG/5ML PO SYRP
5.0000 mL | ORAL_SOLUTION | Freq: Two times a day (BID) | ORAL | Status: DC
  Administered 2018-09-17 – 2018-09-23 (×12): 5 mL
  Filled 2018-09-17 (×12): qty 5

## 2018-09-17 MED ORDER — MIDAZOLAM HCL 2 MG/2ML IJ SOLN
1.0000 mg | INTRAMUSCULAR | Status: DC | PRN
  Administered 2018-09-18 (×2): 2 mg via INTRAVENOUS
  Administered 2018-09-19: 1 mg via INTRAVENOUS
  Administered 2018-09-19 – 2018-09-22 (×13): 2 mg via INTRAVENOUS
  Filled 2018-09-17 (×19): qty 2

## 2018-09-17 NOTE — Progress Notes (Addendum)
Subjective: 2 Days Post-Op Procedure(s) (LRB): Left fibular and tibial open treatment with internal fixation (Left) Patient is resting well on Vent.  Sedated.  CCM notes reviewed.  Objective: Vital signs in last 24 hours: Temp:  [97.6 F (36.4 C)-99.4 F (37.4 C)] 99.4 F (37.4 C) (01/18 0808) Pulse Rate:  [60-102] 75 (01/18 0815) Resp:  [18-30] 20 (01/18 0815) BP: (80-122)/(49-71) 103/64 (01/18 0600) SpO2:  [81 %-100 %] 100 % (01/18 0815) Arterial Line BP: (73-137)/(43-95) 94/53 (01/18 0815) FiO2 (%):  [60 %] 60 % (01/18 0800) Weight:  [155.3 kg] 155.3 kg (01/18 0500)  Intake/Output from previous day: 01/17 0701 - 01/18 0700 In: 4666.8 [I.V.:3979; NG/GT:480; IV Piggyback:207.7] Out: 2500 [Urine:2500] Intake/Output this shift: Total I/O In: 262.9 [I.V.:262.9] Out: -   Recent Labs    09/04/2018 1655 09/16/18 0334  HGB 16.4 14.6   Recent Labs    09/05/2018 1655 09/16/18 0334  WBC 12.7* 8.7  RBC 5.83* 5.08  HCT 59.1* 48.4  PLT 289 203   Recent Labs    09/16/18 0334 09/17/18 0436  NA 138 141  K 3.8 3.5  CL 100 102  CO2 27 28  BUN 21* 31*  CREATININE 2.24* 2.94*  GLUCOSE 160* 137*  CALCIUM 7.6* 7.6*   No results for input(s): LABPT, INR in the last 72 hours.  Obese male intubated and sedated.  L LE splinted.  Toes with brisk cap refill.   Assessment/Plan: 2 Days Post-Op Procedure(s) (LRB): Left fibular and tibial open treatment with internal fixation (Left) Leave splint. NWB LLE CCM management greatly appreciated.  Bryson L Stilwell 09/17/2018, 8:49 AM   Pt seen and examined.  Note above edited as indicated.  We'll continue to follow.

## 2018-09-17 NOTE — Progress Notes (Addendum)
NAME:  Rodney Hoffman Gerard, MRN:  [id number], DOB:  07/19/1966, LOS: 2 ADMISSION DATE:  09/14/2018, CONSULTATION DATE:  1/16 REFERRING MD:  Dr. Victorino DikeHewitt, CHIEF COMPLAINT:  Post op respiratory failure  Brief History   This is a 53 year old male with a history of HTN, OSA and DM who after getting a ORIF of his left fibular and tibial developed hypoxic and hypercarbic respiratory failure requiring intubation and transfer to [facility] and the ICU for airway management.  History of present illness   53 year old male with past medical history as below, which is significant for tobacco abuse and diabetes.  The patient is encephalopathic and therefore history is obtained largely from chart review.  He suffered a left ankle fracture about a year ago and after some lifestyle modification and better management of his diabetes he presented on 1/16 to the outpatient surgery center for left ankle ORIF under Dr. Victorino DikeHewitt.  He was extubated immediately postoperatively, however, he then developed hypoxemia with sats down into the low 80s and altered mental status progressing to obtundation.  [facility] was [facility] to [facility] and [facility] for airway management.  He was given 120 mg IV lasix and had poor output, he became profoundly hypotensive. Bedside u/s showed dilated RV and hyperdynamic LV. Cardiology called and patient was started on levophed drip. He initially had respiratory acidosis and was started on bicarbonate drip, follow up ABG showed respiratory alkalosis so bicarbonate drip was stopped.   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  Past Medical History   Past Medical History:  Diagnosis Date  . Diabetes mellitus without complication (HCC)   . Hypertension   . Sleep apnea    does not wear CPAP     Significant Hospital Events   1/16 ORIF L ankle. Failed extubation. Transfer to [facility]  Consults:    Procedures:  1/16 L ankle ORIF (Dr. Victorino DikeHewitt) 1/16 ETT 09/16/2018  left IJ CVL>> 1-17 2020 radial A-line>>  Significant Diagnostic Tests:   Echocardiogram 09/16/18 Impressions:  - Normal LV size with moderate LV hypertrophy. EF 50-55%, low   normal to mildly decreased. Basal inferior akinesis. Mildly   dilated RV with mild to moderately decreased systolic function.   Dilated IVC suggestive of elevated RV filling pressure.  18 2020 chest x-ray reviewed, lines in appropriate position endotracheal tube appropriately placed finding consistent with left lower lobe atelectasis versus opacity Micro Data:  09/17/2018 blood cultures x2>> 09/17/2018 sputum culture>>  Antimicrobials:  09/17/2018 no antibiotics for now  Interim history/subjective:  Remains intubated on high FiO2 and PEEP.  Not a candidate for any weaning or extubation attempts today renal impairment precludes further diuresis.  Objective   Blood pressure 103/64, pulse 75, temperature 99.4 F (37.4 C), temperature source Axillary, resp. rate 20, height 6\' 1"  (1.854 m), weight (!) 155.3 kg, SpO2 100 %.    Vent Mode: PRVC FiO2 (%):  [60 %] 60 % Set Rate:  [20 bmp-24 bmp] 20 bmp Vt Set:  [550 mL] 550 mL PEEP:  [10 cmH20-14 cmH20] 14 cmH20 Plateau Pressure:  [21 cmH20-34 cmH20] 29 cmH20   Intake/Output Summary (Last 24 hours) at 09/17/2018 0944 Last data filed at 09/17/2018 0830 Gross per 24 hour  Intake 4929.66 ml  Output 2150 ml  Net 2779.66 ml   Filed Weights   09/04/2018 0952 09/16/18 0500 09/17/18 0500  Weight: (!) 151.1 kg (!) 153.3 kg (!) 155.3 kg    Examination: General: Morbidly obese male requiring heavy sedation to  adequately oxygenate. 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  Requiring high FiO2 and elevated PEEP to maintain adequate oxygenation.  Patient was noted to be hypoxic following extubation from the ORIF surgery requiring re-intubation. He had developed respiratory acidosis and was placed on a high TV and RR on the ventilator, he was also started on a bicarbonate drip. During intubation Dr. Molli Knock noted a large amount of pulmonary edema. Patient is currently intubated. . Given his profound hypoxia yesterday and requirement for paralyzation I do not think that we should attempt to extubate today.  09/17/2018.does not meet criteria for extubation -Vent bundle adjust ventilator as able -Pulmonary hygiene -Hold further diuresis -Mucolytic's -Bronchodilators -Serial chest x-ray -Continue with sedation negative RA SS of -1 -Monitor CVP   Shock, most likely multifactorial in the setting of attempted diuresis, grade 1 diastolic dysfunction, dilated right ventricle, elevated right ventricle pressures made worse by propofol drip. -Wean pressors as able -Check procalcitonin for completeness -Check cortisol level -Try to wean propofol. -Check cultures  AKI: Lab Results  Component Value Date   CREATININE 2.94 (H) 09/17/2018   CREATININE 2.24 (H) 09/16/2018   CREATININE 1.69 (H) 09/12/2018   -Monitor creatinine -Avoid nephrotoxins -Maintain adequate hydration -No further diuresis -Monitor CVP -May need renal evaluation in near future  Left ankle injury S/p ORIF on 1/16 by Dr. Victorino Dike -Per Ortho  DM: -Sliding scale insulin per protocol  HTN: -Hold antihypertensives for now   Best practice:  Diet: NPO Pain/Anxiety/Delirium protocol (if indicated): Propofol  for RASS -1 to -2 VAP protocol (if indicated): Yes DVT prophylaxis: Heparin GI prophylaxis: PPI Glucose control: SSI Mobility: Bed ridden Code Status: Full Family Communication:  Disposition: ICU  Labs   CBC: Recent Labs  Lab 09/30/2018 1655 09/16/18 0334  WBC 12.7* 8.7  NEUTROABS 10.9*  --   HGB 16.4 14.6  HCT 59.1* 48.4  MCV 101.4* 95.3  PLT 289 203    Basic Metabolic Panel: Recent Labs  Lab 09/14/18 1200 09/12/2018 1655 09/16/18 0334 09/16/18 1400 09/16/18 1700 09/17/18 0436  NA 141 139 138  --   --  141  K 4.5 5.1 3.8  --   --  3.5  CL 103 99 100  --   --  102  CO2 30 31 27   --   --  28  GLUCOSE 98 242* 160*  --   --  137*  BUN 12 15 21*  --   --  31*  CREATININE 1.20 1.69* 2.24*  --   --  2.94*  CALCIUM 8.8* 8.0* 7.6*  --   --  7.6*  MG  --  2.1 1.7 0.8* 1.7 1.9  PHOS  --  7.7* 1.9* 1.5* 4.3 5.6*   GFR: Estimated Creatinine Clearance: 44.7 mL/min (A) (by C-G formula based on SCr of 2.94 mg/dL (H)). Recent Labs  Lab 09/11/2018 1655 09/14/2018 1715 09/30/2018 2200 09/16/18 0334  WBC 12.7*  --   --  8.7  LATICACIDVEN  --  1.3 2.0*  --     Liver Function Tests: Recent Labs  Lab 09-16-18 1655  AST 19  ALT 14  ALKPHOS 78  BILITOT 0.7  PROT 7.1  ALBUMIN 4.0   No results for input(s): LIPASE, AMYLASE in the last 168 hours. No results for input(s): AMMONIA in the last 168 hours.  ABG    Component Value Date/Time   PHART 7.418 09/17/2018 0525   PCO2ART 47.5 09/17/2018 0525   PO2ART 67.8 (L) 09/17/2018 0525   HCO3 30.1 (H) 09/17/2018 0525   TCO2 35 (H) 09/16/2018 1344   O2SAT 92.9 09/17/2018 0525     Coagulation Profile: No results for input(s): INR, PROTIME in the last 168 hours.  Cardiac Enzymes: Recent Labs  Lab 09/16/2018 1655  TROPONINI <0.03    HbA1C: No results found for: HGBA1C  CBG: Recent Labs  Lab 09/16/18 1509 09/16/18 1936 09/16/18 2341 09/17/18 0347 09/17/18 0805  GLUCAP 93 163* 129* 136* 144*      Critical care  time:  App CCT 38 min    Brett Canales Asami Lambright ACNP [facility] Pager [phone] till 1 pm If no answer page [phone] 09/17/2018, 9:44 AM

## 2018-09-17 NOTE — Progress Notes (Signed)
Due to peak and plateau pressures rising following a decrease in sedation, RT attempted PC mode on vent per NP request: PC 16 Peep 14 RR 20 FIO2 60%  Pt's VT dropped to 262.  NP notified.  Pt placed back on prior PRVC settings.

## 2018-09-18 ENCOUNTER — Inpatient Hospital Stay (HOSPITAL_COMMUNITY): Payer: No Typology Code available for payment source

## 2018-09-18 LAB — BLOOD GAS, ARTERIAL
Acid-Base Excess: 3.8 mmol/L — ABNORMAL HIGH (ref 0.0–2.0)
BICARBONATE: 27.7 mmol/L (ref 20.0–28.0)
Drawn by: 418751
FIO2: 40
MECHVT: 550 mL
O2 Saturation: 96 %
PEEP: 14 cmH2O
PH ART: 7.446 (ref 7.350–7.450)
Patient temperature: 98.6
RATE: 20 resp/min
pCO2 arterial: 40.9 mmHg (ref 32.0–48.0)
pO2, Arterial: 78.7 mmHg — ABNORMAL LOW (ref 83.0–108.0)

## 2018-09-18 LAB — CBC WITH DIFFERENTIAL/PLATELET
ABS IMMATURE GRANULOCYTES: 0.03 10*3/uL (ref 0.00–0.07)
Basophils Absolute: 0.1 10*3/uL (ref 0.0–0.1)
Basophils Relative: 1 %
Eosinophils Absolute: 0.2 10*3/uL (ref 0.0–0.5)
Eosinophils Relative: 3 %
HCT: 42.8 % (ref 39.0–52.0)
HEMOGLOBIN: 13 g/dL (ref 13.0–17.0)
Immature Granulocytes: 1 %
LYMPHS ABS: 0.9 10*3/uL (ref 0.7–4.0)
Lymphocytes Relative: 15 %
MCH: 29 pg (ref 26.0–34.0)
MCHC: 30.4 g/dL (ref 30.0–36.0)
MCV: 95.5 fL (ref 80.0–100.0)
Monocytes Absolute: 0.4 10*3/uL (ref 0.1–1.0)
Monocytes Relative: 7 %
Neutro Abs: 4.3 10*3/uL (ref 1.7–7.7)
Neutrophils Relative %: 73 %
Platelets: 201 10*3/uL (ref 150–400)
RBC: 4.48 MIL/uL (ref 4.22–5.81)
RDW: 15.7 % — ABNORMAL HIGH (ref 11.5–15.5)
WBC: 5.9 10*3/uL (ref 4.0–10.5)
nRBC: 0 % (ref 0.0–0.2)

## 2018-09-18 LAB — PROCALCITONIN: Procalcitonin: 7.05 ng/mL

## 2018-09-18 LAB — BASIC METABOLIC PANEL
Anion gap: 12 (ref 5–15)
BUN: 43 mg/dL — ABNORMAL HIGH (ref 6–20)
CHLORIDE: 108 mmol/L (ref 98–111)
CO2: 25 mmol/L (ref 22–32)
Calcium: 7.8 mg/dL — ABNORMAL LOW (ref 8.9–10.3)
Creatinine, Ser: 2.19 mg/dL — ABNORMAL HIGH (ref 0.61–1.24)
GFR calc Af Amer: 38 mL/min — ABNORMAL LOW (ref >60)
GFR calc non Af Amer: 33 mL/min — ABNORMAL LOW (ref >60)
Glucose, Bld: 104 mg/dL — ABNORMAL HIGH (ref 70–99)
Potassium: 3.6 mmol/L (ref 3.5–5.1)
Sodium: 145 mmol/L (ref 135–145)

## 2018-09-18 LAB — BLOOD CULTURE ID PANEL (REFLEXED)
Acinetobacter baumannii: NOT DETECTED
CANDIDA KRUSEI: NOT DETECTED
Candida albicans: NOT DETECTED
Candida glabrata: NOT DETECTED
Candida parapsilosis: NOT DETECTED
Candida tropicalis: NOT DETECTED
ENTEROCOCCUS SPECIES: NOT DETECTED
Enterobacter cloacae complex: NOT DETECTED
Enterobacteriaceae species: NOT DETECTED
Escherichia coli: NOT DETECTED
Haemophilus influenzae: NOT DETECTED
Klebsiella oxytoca: NOT DETECTED
Klebsiella pneumoniae: NOT DETECTED
Listeria monocytogenes: NOT DETECTED
Neisseria meningitidis: NOT DETECTED
PSEUDOMONAS AERUGINOSA: NOT DETECTED
Proteus species: NOT DETECTED
STAPHYLOCOCCUS AUREUS BCID: NOT DETECTED
STREPTOCOCCUS PNEUMONIAE: NOT DETECTED
Serratia marcescens: NOT DETECTED
Staphylococcus species: NOT DETECTED
Streptococcus agalactiae: NOT DETECTED
Streptococcus pyogenes: NOT DETECTED
Streptococcus species: DETECTED — AB

## 2018-09-18 LAB — GLUCOSE, CAPILLARY
GLUCOSE-CAPILLARY: 103 mg/dL — AB (ref 70–99)
Glucose-Capillary: 99 mg/dL (ref 70–99)
Glucose-Capillary: 109 mg/dL — ABNORMAL HIGH (ref 70–99)
Glucose-Capillary: 91 mg/dL (ref 70–99)
Glucose-Capillary: 88 mg/dL (ref 70–99)
Glucose-Capillary: 65 mg/dL — ABNORMAL LOW (ref 70–99)
Glucose-Capillary: 91 mg/dL (ref 70–99)

## 2018-09-18 LAB — MAGNESIUM: Magnesium: 2.3 mg/dL (ref 1.7–2.4)

## 2018-09-18 LAB — PHOSPHORUS: Phosphorus: 3.8 mg/dL (ref 2.5–4.6)

## 2018-09-18 MED ORDER — FUROSEMIDE 10 MG/ML IJ SOLN
40.0000 mg | Freq: Once | INTRAMUSCULAR | Status: AC
  Administered 2018-09-18: 40 mg via INTRAVENOUS
  Filled 2018-09-18: qty 4

## 2018-09-18 MED ORDER — QUETIAPINE FUMARATE 50 MG PO TABS
50.0000 mg | ORAL_TABLET | Freq: Two times a day (BID) | ORAL | Status: DC
  Administered 2018-09-18: 50 mg via ORAL
  Filled 2018-09-18: qty 1

## 2018-09-18 MED ORDER — QUETIAPINE FUMARATE 50 MG PO TABS
50.0000 mg | ORAL_TABLET | Freq: Two times a day (BID) | ORAL | Status: DC
  Administered 2018-09-18 – 2018-09-20 (×5): 50 mg
  Filled 2018-09-18 (×5): qty 1

## 2018-09-18 MED ORDER — OXYCODONE HCL 5 MG PO TABS
10.0000 mg | ORAL_TABLET | Freq: Four times a day (QID) | ORAL | Status: DC
  Administered 2018-09-18 – 2018-09-24 (×22): 10 mg
  Filled 2018-09-18 (×22): qty 2

## 2018-09-18 MED ORDER — DEXTROSE 50 % IV SOLN
INTRAVENOUS | Status: AC
  Administered 2018-09-18: 25 mL
  Filled 2018-09-18: qty 50

## 2018-09-18 NOTE — Progress Notes (Signed)
   Subjective: 3 Days Post-Op Procedure(s) (LRB): Left fibular and tibial open treatment with internal fixation (Left)  Pt remains intubated and sedated  Dressing in place to left lower extremity   Objective:   VITALS:   Vitals:   09/18/18 0400 09/18/18 0500  BP: 121/69 111/62  Pulse: 63 67  Resp: 20 20  Temp:    SpO2: 100% 100%    Left lower extremity: dressing intact Cap refill less than 2 seconds bilaterally  LABS Recent Labs    10/08/2018 1655 09/16/18 0334 09/18/18 0439  HGB 16.4 14.6 13.0  HCT 59.1* 48.4 42.8  WBC 12.7* 8.7 5.9  PLT 289 203 201    Recent Labs    09/16/18 0334 09/17/18 0436 09/18/18 0439  NA 138 141 145  K 3.8 3.5 3.6  BUN 21* 31* 43*  CREATININE 2.24* 2.94* 2.19*  GLUCOSE 160* 137* 104*     Assessment/Plan: 3 Days Post-Op Procedure(s) (LRB): Left fibular and tibial open treatment with internal fixation (Left) Continue non weight bearing left lower extremity once extubated Will continue to monitor his progress    Alphonsa Overall PA-C, MPAS [facility] is now [facility] [employer] [facility] [address], [place], [place] [address] Phone: [phone] [email or web address] Facebook  [employer]

## 2018-09-18 NOTE — Progress Notes (Signed)
NAME:  Rodney Hoffman, MRN:  [id number], DOB:  02/16/1966, LOS: 3 ADMISSION DATE:  09/18/2018, CONSULTATION DATE:  1/16 REFERRING MD:  Dr. Victorino DikeHewitt, CHIEF COMPLAINT:  Post op respiratory failure  Brief History   This is a 53 year old male with a history of HTN, OSA and DM who after getting a ORIF of his left fibular and tibial developed hypoxic and hypercarbic respiratory failure requiring intubation and transfer to [facility] and the ICU for airway management.  History of present illness   53 year old male with past medical history as below, which is significant for tobacco abuse and diabetes.  The patient is encephalopathic and therefore history is obtained largely from chart review.  He suffered a left ankle fracture about a year ago and after some lifestyle modification and better management of his diabetes he presented on 1/16 to the outpatient surgery center for left ankle ORIF under Dr. Victorino DikeHewitt.  He was extubated immediately postoperatively, however, he then developed hypoxemia with sats down into the low 80s and altered mental status progressing to obtundation.  He was transferred to [facility] and PCCM for airway management.  He was given 120 mg IV lasix and had poor output, he became profoundly hypotensive. Bedside u/s showed dilated RV and hyperdynamic LV. Cardiology called and patient was started on levophed drip. He initially had respiratory acidosis and was started on bicarbonate drip, follow up ABG showed respiratory alkalosis so bicarbonate drip was stopped.   Past Medical History   Past Medical History:  Diagnosis Date  . Diabetes mellitus without complication (HCC)   . Hypertension   . Sleep apnea    does not wear CPAP     Significant Hospital Events   1/16 ORIF L ankle. Failed extubation. Transfer to [facility]CCM  Consults:    Procedures:  1/16 L ankle ORIF (Dr. Victorino DikeHewitt) 1/16 ETT  Significant Diagnostic Tests:   Echocardiogram 09/16/18 Impressions:  - Normal LV size  with moderate LV hypertrophy. EF 50-55%, low   normal to mildly decreased. Basal inferior akinesis. Mildly   dilated RV with mild to moderately decreased systolic function.   Dilated IVC suggestive of elevated RV filling pressure. Micro Data:    Antimicrobials:     Interim history/subjective:  Patient is currently intubated and sedated, no family at bedside.  Patient has still been requiring the levophed drip however was off of it when we rounded on him later in the day.  Nurse had attempted to wean him off the sedatives however he became very agitated and sat up int he bed so they were restarted.   Objective   Blood pressure 111/62, pulse 67, temperature 99.4 F (37.4 C), temperature source Axillary, resp. rate 20, height 6\' 1"  (1.854 m), weight (!) 156.5 kg, SpO2 100 %. CVP:  [12 mmHg-16 mmHg] 14 mmHg  Vent Mode: PRVC FiO2 (%):  [40 %-60 %] 40 % Set Rate:  [20 bmp] 20 bmp Vt Set:  [550 mL] 550 mL PEEP:  [14 cmH20] 14 cmH20 Plateau Pressure:  [28 cmH20-35 cmH20] 28 cmH20   Intake/Output Summary (Last 24 hours) at 09/18/2018 [id number] Last data filed at 09/18/2018 0500 Gross per 24 hour  Intake 4923.38 ml  Output 3484 ml  Net 1439.38 ml   Filed Weights   09/16/18 0500 09/17/18 0500 09/18/18 0500  Weight: (!) 153.3 kg (!) 155.3 kg (!) 156.5 kg    Examination: General: Intubated, left central line and left arterial line in place, obese male HENT: Normocephalic, atraumatic Lungs: Minimal  bibasilar crackles, no increased work of breathing  Cardiovascular: RRR, no m/r/g Abdomen: soft, no guarding Extremities: Left leg in splint, trace edema Neuro: Sedated GU: Foley in place  Resolved Hospital Problem list     Assessment & Plan:   Acute hypoxic respiratory failure:  Currently intubated: Respiratory acidosis: Patient was noted to be hypoxic following extubation from the ORIF surgery requiring re-intubation. He had developed respiratory acidosis and was placed on a high TV  and RR on the ventilator, he was also started on a bicarbonate drip. During intubation Dr. Molli KnockYacoub noted a large amount of pulmonary edema. Patient is currently intubated and was requiring high FiO2 and PEEP yesterday, today he is currently on FiO2 40%, RR 14, PEEP 20, and TV 550. Some bibasialr crackles on exam, trace edema. Given his improvement we will start to wean fentanyl and propofol as tolerated and will add other medications to limit the anxiety and agitation that he gets.  -Pulmonary hygiene  -CXR showed improvement in his pulmonary edema.  -Daily SBT, daily sedation vacation -Continue fentanyl and propofol drip for sedation, wean as tolerated -Add oxycodone 10 q6 hr  -Add versed 1-2 mg PRN -Add seroquel 50 mg BID -Lasix 40 mg IV once -Monitor CVP  Shock: Most likely multifactorial in the setting of attempted diuresis, grade 1 diastolic dysfunction, dilated right ventricle, elevated right ventricle pressures made worse by propofol drip. -Wean pressors as able, still on Levophed drip -Cortisol level > 134 -Try to wean propofol. -Check cultures  AKI: Cr improved today to 2.19, down from 2.94 yesterday. Likely 2/2 pre-renal azotemia from over-diuresis.  -BMP in AM  Left ankle injury -S/p ORIF on 1/16 by Dr. Victorino DikeHewitt -Per ortho >> reinforce splint as needed, will need PT/OT as he becomes stable  DM: -SSI -Frequent CBGs  HTN: -Patient is on lisinopril at home, BP today was on the softer side and he had required a levoped drip.  -Hold Lisinopril -Can restart as necessary   Best practice:  Diet: Tube feeds Pain/Anxiety/Delirium protocol (if indicated): Propofol for RASS -1 to -2 VAP protocol (if indicated): Yes DVT prophylaxis: Heparin GI prophylaxis: PPI Glucose control: SSI Mobility: Bed ridden Code Status: Full Family Communication:  Disposition: Remain in ICU  Labs   CBC: Recent Labs  Lab August 14, 2019 1655 09/16/18 0334 09/18/18 0439  WBC 12.7* 8.7 5.9  NEUTROABS  10.9*  --  4.3  HGB 16.4 14.6 13.0  HCT 59.1* 48.4 42.8  MCV 101.4* 95.3 95.5  PLT 289 203 201  3  Basic Metabolic Panel: Recent Labs  Lab 09/14/18 1200  August 14, 2019 1655 09/16/18 0334 09/16/18 1400 09/16/18 1700 09/17/18 0436 09/17/18 1843 09/18/18 0439  NA 141  --  139 138  --   --  141  --  145  K 4.5  --  5.1 3.8  --   --  3.5  --  3.6  CL 103  --  99 100  --   --  102  --  108  CO2 30  --  31 27  --   --  28  --  25  GLUCOSE 98  --  242* 160*  --   --  137*  --  104*  BUN 12  --  15 21*  --   --  31*  --  43*  CREATININE 1.20  --  1.69* 2.24*  --   --  2.94*  --  2.19*  CALCIUM 8.8*  --  8.0* 7.6*  --   --  7.6*  --  7.8*  MG  --    < > 2.1 1.7 0.8* 1.7 1.9 2.0 2.3  PHOS  --    < > 7.7* 1.9* 1.5* 4.3 5.6* 4.4 3.8   < > = values in this interval not displayed.   GFR: Estimated Creatinine Clearance: 60.3 mL/min (A) (by C-G formula based on SCr of 2.19 mg/dL (H)). Recent Labs  Lab 09/28/2018 1655 09/08/2018 1715 09/12/2018 2200 09/16/18 0334 09/17/18 1058 09/18/18 0439  PROCALCITON  --   --   --   --  13.59  --   WBC 12.7*  --   --  8.7  --  5.9  LATICACIDVEN  --  1.3 2.0*  --   --   --     Liver Function Tests: Recent Labs  Lab 09/23/2018 1655  AST 19  ALT 14  ALKPHOS 78  BILITOT 0.7  PROT 7.1  ALBUMIN 4.0   No results for input(s): LIPASE, AMYLASE in the last 168 hours. No results for input(s): AMMONIA in the last 168 hours.  ABG    Component Value Date/Time   PHART 7.446 09/18/2018 0330   PCO2ART 40.9 09/18/2018 0330   PO2ART 78.7 (L) 09/18/2018 0330   HCO3 27.7 09/18/2018 0330   TCO2 35 (H) 09/16/2018 1344   O2SAT 96.0 09/18/2018 0330     Coagulation Profile: No results for input(s): INR, PROTIME in the last 168 hours.  Cardiac Enzymes: Recent Labs  Lab 09/24/2018 1655  TROPONINI <0.03    HbA1C: No results found for: HGBA1C  CBG: Recent Labs  Lab 09/17/18 1346 09/17/18 1556 09/17/18 1957 09/17/18 2331 09/18/18 0447  GLUCAP 105* 130*  123* 150* 99    Review of Systems:   Currently intubated and sedated  Past Medical History  He,  has a past medical history of Diabetes mellitus without complication (HCC), Hypertension, and Sleep apnea.   Surgical History    Past Surgical History:  Procedure Laterality Date  . APPENDECTOMY  1979  . ORIF ANKLE FRACTURE Left 09/09/2018   Procedure: Left fibular and tibial open treatment with internal fixation;  Surgeon: Toni Arthurs, MD;  Location: [facility];  Service: Orthopedics;  Laterality: Left;   Outpatient in bed     Social History   reports that he quit smoking about 8 weeks ago. His smoking use included cigarettes. He has never used smokeless tobacco. He reports previous alcohol use. He reports that he does not use drugs.   Family History   His family history is not on file.   Allergies No Known Allergies   Home Medications  Prior to Admission medications   Medication Sig Start Date End Date Taking? Authorizing Provider  glipiZIDE (GLUCOTROL) 5 MG tablet Take by mouth 2 (two) times daily before a meal.   Yes [provider]  lisinopril (PRINIVIL,ZESTRIL) 10 MG tablet Take 10 mg by mouth daily.   Yes [provider]  metFORMIN (GLUCOPHAGE) 500 MG tablet Take by mouth 2 (two) times daily with a meal.   Yes [provider]  aspirin EC 81 MG tablet Take 1 tablet (81 mg total) by mouth 2 (two) times daily. 09/28/2018   Jacinta Shoe, PA-C  docusate sodium (COLACE) 100 MG capsule Take 1 capsule (100 mg total) by mouth 2 (two) times daily. While taking narcotic pain medicine. 09/24/2018   Jacinta Shoe, PA-C  oxyCODONE (ROXICODONE) 5 MG immediate release tablet Take 1 tablet (5 mg total) by  mouth every 4 (four) hours as needed for up to 5 days for severe pain. 09/14/2018 09/20/18  Jacinta Shoe, PA-C  senna (SENOKOT) 8.6 MG TABS tablet Take 2 tablets (17.2 mg total) by mouth 2 (two) times daily. 09/04/2018   Jacinta Shoe,  PA-C     Critical care time:     Claudean Severance, M.D. PGY1 Pager [phone] 09/18/2018 6:14 AM

## 2018-09-18 NOTE — Progress Notes (Signed)
PHARMACY - PHYSICIAN COMMUNICATION CRITICAL VALUE ALERT - BLOOD CULTURE IDENTIFICATION (BCID)  Rodney Hoffman is an 53 y.o. male who presented to [facility] on 09/26/2018 s/p L ankle ORIF and post-operative respiratory failure. Patient experiencing multifactorial shock, likely in the setting of attempted diuresis, grade 1 diastolic dysfunction, dilated right ventricle, elevated right ventricle pressures, and propofol drip. Blood cultures drawn 1/18 to r/o septic shock. 1/2 BCx now growing GPC and BCID Strep species. Patient is AF, WBC WNL.  Name of physician (or Provider) Contacted: Hermine Messick  Current antibiotics: None  Changes to prescribed antibiotics recommended:  Likely contaminant. Continue to monitor off antibiotics.  Results for orders placed or performed during the hospital encounter of 09/20/2018  Blood Culture ID Panel (Reflexed) (Collected: 09/17/2018 10:55 AM)  Result Value Ref Range   Enterococcus species NOT DETECTED NOT DETECTED   Listeria monocytogenes NOT DETECTED NOT DETECTED   Staphylococcus species NOT DETECTED NOT DETECTED   Staphylococcus aureus (BCID) NOT DETECTED NOT DETECTED   Streptococcus species DETECTED (A) NOT DETECTED   Streptococcus agalactiae NOT DETECTED NOT DETECTED   Streptococcus pneumoniae NOT DETECTED NOT DETECTED   Streptococcus pyogenes NOT DETECTED NOT DETECTED   Acinetobacter baumannii NOT DETECTED NOT DETECTED   Enterobacteriaceae species NOT DETECTED NOT DETECTED   Enterobacter cloacae complex NOT DETECTED NOT DETECTED   Escherichia coli NOT DETECTED NOT DETECTED   Klebsiella oxytoca NOT DETECTED NOT DETECTED   Klebsiella pneumoniae NOT DETECTED NOT DETECTED   Proteus species NOT DETECTED NOT DETECTED   Serratia marcescens NOT DETECTED NOT DETECTED   Haemophilus influenzae NOT DETECTED NOT DETECTED   Neisseria meningitidis NOT DETECTED NOT DETECTED   Pseudomonas aeruginosa NOT DETECTED NOT DETECTED   Candida albicans NOT DETECTED NOT  DETECTED   Candida glabrata NOT DETECTED NOT DETECTED   Candida krusei NOT DETECTED NOT DETECTED   Candida parapsilosis NOT DETECTED NOT DETECTED   Candida tropicalis NOT DETECTED NOT DETECTED    N. Zigmund Daniel, PharmD, BCPS PGY2 Infectious Diseases Pharmacy Resident Phone: [phone] 09/18/2018  8:32 AM

## 2018-09-18 NOTE — Progress Notes (Signed)
Hypoglycemic Event  CBG: 65  Treatment: 12.5g/99mL D50  Follow-up CBG: Time: 2105 CBG Result: 91   Rodney Hoffman

## 2018-09-19 LAB — BASIC METABOLIC PANEL
ANION GAP: 10 (ref 5–15)
BUN: 51 mg/dL — ABNORMAL HIGH (ref 6–20)
CO2: 25 mmol/L (ref 22–32)
Calcium: 8.2 mg/dL — ABNORMAL LOW (ref 8.9–10.3)
Chloride: 111 mmol/L (ref 98–111)
Creatinine, Ser: 2.19 mg/dL — ABNORMAL HIGH (ref 0.61–1.24)
GFR calc Af Amer: 38 mL/min — ABNORMAL LOW (ref >60)
GFR calc non Af Amer: 33 mL/min — ABNORMAL LOW (ref >60)
Glucose, Bld: 126 mg/dL — ABNORMAL HIGH (ref 70–99)
Potassium: 3.8 mmol/L (ref 3.5–5.1)
Sodium: 146 mmol/L — ABNORMAL HIGH (ref 135–145)

## 2018-09-19 LAB — CULTURE, RESPIRATORY W GRAM STAIN
Culture: NO GROWTH
Special Requests: NORMAL

## 2018-09-19 LAB — GLUCOSE, CAPILLARY
Glucose-Capillary: 70 mg/dL (ref 70–99)
Glucose-Capillary: 74 mg/dL (ref 70–99)
Glucose-Capillary: 115 mg/dL — ABNORMAL HIGH (ref 70–99)
Glucose-Capillary: 100 mg/dL — ABNORMAL HIGH (ref 70–99)
Glucose-Capillary: 117 mg/dL — ABNORMAL HIGH (ref 70–99)
Glucose-Capillary: 120 mg/dL — ABNORMAL HIGH (ref 70–99)
Glucose-Capillary: 140 mg/dL — ABNORMAL HIGH (ref 70–99)

## 2018-09-19 LAB — TRIGLYCERIDES: Triglycerides: 290 mg/dL — ABNORMAL HIGH (ref <150)

## 2018-09-19 LAB — PROCALCITONIN: Procalcitonin: 5.32 ng/mL

## 2018-09-19 MED ORDER — FREE WATER
200.0000 mL | Freq: Three times a day (TID) | Status: DC
  Administered 2018-09-19 – 2018-09-23 (×12): 200 mL

## 2018-09-19 MED ORDER — ADULT MULTIVITAMIN LIQUID CH
15.0000 mL | Freq: Every day | ORAL | Status: DC
  Administered 2018-09-19 – 2018-09-23 (×5): 15 mL via ORAL
  Filled 2018-09-19 (×5): qty 15

## 2018-09-19 MED ORDER — CLONAZEPAM 0.5 MG PO TBDP
0.5000 mg | ORAL_TABLET | Freq: Two times a day (BID) | ORAL | Status: DC
  Administered 2018-09-19 – 2018-09-23 (×10): 0.5 mg via ORAL
  Filled 2018-09-19 (×10): qty 1

## 2018-09-19 MED ORDER — VITAL HIGH PROTEIN PO LIQD
1000.0000 mL | ORAL | Status: DC
  Administered 2018-09-19 – 2018-09-22 (×4): 1000 mL

## 2018-09-19 MED ORDER — FUROSEMIDE 10 MG/ML IJ SOLN
40.0000 mg | Freq: Once | INTRAMUSCULAR | Status: AC
  Administered 2018-09-19: 40 mg via INTRAVENOUS
  Filled 2018-09-19: qty 4

## 2018-09-19 MED ORDER — PRO-STAT SUGAR FREE PO LIQD
60.0000 mL | Freq: Three times a day (TID) | ORAL | Status: DC
  Administered 2018-09-19 – 2018-09-22 (×10): 60 mL
  Filled 2018-09-19 (×12): qty 60

## 2018-09-19 MED ORDER — HYDRALAZINE HCL 20 MG/ML IJ SOLN
INTRAMUSCULAR | Status: AC
  Filled 2018-09-19: qty 1

## 2018-09-19 MED ORDER — DEXMEDETOMIDINE HCL IN NACL 400 MCG/100ML IV SOLN
0.4000 ug/kg/h | INTRAVENOUS | Status: DC
  Administered 2018-09-19: 0.4 ug/kg/h via INTRAVENOUS
  Administered 2018-09-19: 1.2 ug/kg/h via INTRAVENOUS
  Administered 2018-09-19: 0.7 ug/kg/h via INTRAVENOUS
  Administered 2018-09-20: 0.8 ug/kg/h via INTRAVENOUS
  Administered 2018-09-20: 1.2 ug/kg/h via INTRAVENOUS
  Administered 2018-09-20: 1 ug/kg/h via INTRAVENOUS
  Administered 2018-09-20 – 2018-09-21 (×17): 1.2 ug/kg/h via INTRAVENOUS
  Administered 2018-09-22 (×5): 1.5 ug/kg/h via INTRAVENOUS
  Administered 2018-09-22 (×2): 1.2 ug/kg/h via INTRAVENOUS
  Administered 2018-09-22: 1.5 ug/kg/h via INTRAVENOUS
  Administered 2018-09-22 (×2): 1.2 ug/kg/h via INTRAVENOUS
  Administered 2018-09-22: 1.5 ug/kg/h via INTRAVENOUS
  Administered 2018-09-22: 1.2 ug/kg/h via INTRAVENOUS
  Administered 2018-09-23 (×5): 1.5 ug/kg/h via INTRAVENOUS
  Filled 2018-09-19 (×4): qty 100
  Filled 2018-09-19: qty 200
  Filled 2018-09-19: qty 100
  Filled 2018-09-19: qty 200
  Filled 2018-09-19: qty 100
  Filled 2018-09-19: qty 200
  Filled 2018-09-19 (×3): qty 100
  Filled 2018-09-19: qty 200
  Filled 2018-09-19 (×10): qty 100
  Filled 2018-09-19: qty 200
  Filled 2018-09-19: qty 100
  Filled 2018-09-19: qty 200
  Filled 2018-09-19 (×6): qty 100
  Filled 2018-09-19 (×2): qty 200
  Filled 2018-09-19: qty 100

## 2018-09-19 MED ORDER — HYDRALAZINE HCL 20 MG/ML IJ SOLN
5.0000 mg | INTRAMUSCULAR | Status: DC | PRN
  Administered 2018-09-19 – 2018-09-23 (×2): 5 mg via INTRAVENOUS
  Filled 2018-09-19 (×2): qty 1

## 2018-09-19 MED ORDER — CLONAZEPAM 0.1 MG/ML ORAL SUSPENSION
0.5000 mg | Freq: Two times a day (BID) | ORAL | Status: DC
  Filled 2018-09-19: qty 5

## 2018-09-19 NOTE — Progress Notes (Addendum)
Nutrition Follow-up  DOCUMENTATION CODES:   Morbid obesity  INTERVENTION:   -Vital High Protein @ 40 ml/hr (960 ml) via OGT -60 ml Prostat TID -Free water flushes 200 ml q8 hours  -Liquid MVI  Provides: 1560 kcals (2035 kcal with propofol), 174 grams protein, 803 ml free water (1403 ml with flushes). Meets 100% of needs.   NUTRITION DIAGNOSIS:   Inadequate oral intake related to inability to eat as evidenced by NPO status.  Ongoing  GOAL:   Provide needs based on ASPEN/SCCM guidelines  Addressed via TF  MONITOR:   Vent status, Weight trends, Labs, Skin, I & O's  REASON FOR ASSESSMENT:   Ventilator    ASSESSMENT:   53 y.o. male admitted with PMH of smoking and DM. 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 He has stopped smoking, and his A1C is below 7. He presented to [facility] for operative treatment of painful L ankle injury.   Spoke with RN. Propofol cut to half rate this am, pt tolerating it well. No plans for extubation as weaning trials have not been started. CBGs show 65-115 for the last 24 hrs. Will increase rate of TF, suspect this will help maintain blood sugar.   Weight noted to increase from 153.3 kg on 1/17 to 158 kg today. Pt continues with IV lasix. Will utilize dry wt of 144.8 kg to estimate needs.   Patient is currently intubated on ventilator support MV: 11.0 L/min Temp (24hrs), Avg:99.5 F (37.5 C), Min:98.8 F (37.1 C), Max:100 F (37.8 C) BP: 121/68 MAP: 82 Propofol: 18 ml/hr- provides 475 kcal  I/O: +7.4 L since admit UOP: 2625 ml x 24 hrs   Medications reviewed and include: colace, SSI, senokot, fentanyl Labs reviewed: Na 146 (H) CBG 65-115  Diet Order:   Diet Order            Diet NPO time specified  Diet effective now        Diet - low sodium heart healthy              EDUCATION NEEDS:   Not  appropriate for education at this time  Skin:  Skin Assessment: Skin Integrity Issues: Skin Integrity Issues:: Incisions Incisions: L ankle (09/07/2018)  Last BM:  PTA/unknown  Height:   Ht Readings from Last 1 Encounters:  09/04/2018 6\' 1"  (1.854 m)    Weight:   Wt Readings from Last 1 Encounters:  09/19/18 (!) 158 kg    Ideal Body Weight:  83.64 kg  BMI:  Body mass index is 45.96 kg/m.  Estimated Nutritional Needs:   Kcal:  1504-1364 kcal  Protein:  167-209 grams   Fluid:  >/= 1.5 L/day   Vanessa Kick RD, LDN Clinical Nutrition Pager # - [phone]

## 2018-09-19 NOTE — Progress Notes (Signed)
Subjective: 4 Days Post-Op Procedure(s) (LRB): Left fibular and tibial open treatment with internal fixation (Left)  Patient intubated and sedated.  Objective:   VITALS:  Temp:  [98.8 F (37.1 C)-100 F (37.8 C)] 99.3 F (37.4 C) (01/20 0727) Pulse Rate:  [60-86] 63 (01/20 0736) Resp:  [20] 20 (01/20 0736) BP: (91-119)/(54-70) 113/63 (01/20 0700) SpO2:  [98 %-100 %] 100 % (01/20 0736) Arterial Line BP: (66-133)/(58-90) 131/66 (01/20 0736) FiO2 (%):  [40 %] 40 % (01/20 0400) Weight:  [158 kg] 158 kg (01/20 0400)   L LE SLS C/D/I Popliteal pulse 2+  LABS Recent Labs    09/18/18 0439  HGB 13.0  WBC 5.9  PLT 201   Recent Labs    09/17/18 0436 09/18/18 0439  NA 141 145  K 3.5 3.6  CL 102 108  CO2 28 25  BUN 31* 43*  CREATININE 2.94* 2.19*  GLUCOSE 137* 104*   No results for input(s): LABPT, INR in the last 72 hours.   Assessment/Plan: 4 Days Post-Op Procedure(s) (LRB): Left fibular and tibial open treatment with internal fixation (Left)  NWB L LE Reinforce splint prn CCM management sincerely appreciated.  Alfredo Martinez PA-C [facility] Office:  [phone]

## 2018-09-19 NOTE — Progress Notes (Signed)
NAME:  Rodney Hoffman, MRN:  [id number], DOB:  April 12, 1966, LOS: 4 ADMISSION DATE:  10-06-18, CONSULTATION DATE:  1/16 REFERRING MD:  Dr. Victorino Dike, CHIEF COMPLAINT:  Post op respiratory failure  Brief History   This is a 53 year old male with a history of HTN, OSA and DM who after getting a ORIF of his left fibular and tibial developed hypoxic and hypercarbic respiratory failure requiring intubation and transfer to [facility] and the ICU for airway management.  History of present illness   53 year old male with past medical history as below, which is significant for tobacco abuse and diabetes.  The patient is encephalopathic and therefore history is obtained largely from chart review.  He suffered a left ankle fracture about a year ago and after some lifestyle modification and better management of his diabetes he presented on 1/16 to the outpatient surgery center for left ankle ORIF under Dr. Victorino Dike.  He was extubated immediately postoperatively, however, he then developed hypoxemia with sats down into the low 80s and altered mental status progressing to obtundation.  He was transferred to [facility] and [facility] for airway management.  He was given 120 mg IV lasix and had poor output, he became profoundly hypotensive. Bedside u/s showed dilated RV and hyperdynamic LV. Cardiology called and patient was started on levophed drip. He initially had respiratory acidosis and was started on bicarbonate drip, follow up ABG showed respiratory alkalosis so bicarbonate drip was stopped.   Past Medical History   Past Medical History:  Diagnosis Date  . Diabetes mellitus without complication (HCC)   . Hypertension   . Sleep apnea    does not wear CPAP     Significant Hospital Events   1/16 ORIF L ankle. Failed extubation. Transfer to [facility]  Consults:    Procedures:  1/16 L ankle ORIF (Dr. Victorino Dike) 1/16 ETT  Significant Diagnostic Tests:   Echocardiogram 09/16/18 Impressions:  - Normal LV size  with moderate LV hypertrophy. EF 50-55%, low   normal to mildly decreased. Basal inferior akinesis. Mildly   dilated RV with mild to moderately decreased systolic function.   Dilated IVC suggestive of elevated RV filling pressure. Micro Data:    Antimicrobials:     Interim history/subjective:  Patient is currently intubated and sedated, no family at bedside.  Off pressors today. Patient was noted to have an elevated CVP yesterday and fluids were discontinued. Net output of -1L yesterday. We started oral sedative medications yesterday to attempt to wean off propofol and fentanyl drips.   Objective   Blood pressure (!) 109/59, pulse 68, temperature 98.8 F (37.1 C), temperature source Axillary, resp. rate 20, height 6\' 1"  (1.854 m), weight (!) 158 kg, SpO2 100 %. CVP:  [13 mmHg-25 mmHg] 24 mmHg  Vent Mode: PRVC FiO2 (%):  [40 %] 40 % Set Rate:  [20 bmp] 20 bmp Vt Set:  [550 mL] 550 mL PEEP:  [10 cmH20-14 cmH20] 10 cmH20 Plateau Pressure:  [25 cmH20-28 cmH20] 26 cmH20   Intake/Output Summary (Last 24 hours) at 09/19/2018 0659 Last data filed at 09/19/2018 0600 Gross per 24 hour  Intake 3692.36 ml  Output 2625 ml  Net 1067.36 ml   Filed Weights   09/17/18 0500 09/18/18 0500 09/19/18 0400  Weight: (!) 155.3 kg (!) 156.5 kg (!) 158 kg    Examination: General: Intubated, left central line and left arterial line in place, obese male HENT: Normocephalic, atraumatic Lungs: Minimal bibasilar crackles, no increased work of breathing  Cardiovascular: RRR,  no m/r/g Abdomen: soft, no guarding Extremities: Left leg in splint, trace edema Neuro: Sedated GU: Foley in place  Resolved Hospital Problem list     Assessment & Plan:   Acute hypoxic respiratory failure:  Currently intubated: Respiratory acidosis: Patient was noted to be hypoxic following extubation from the ORIF surgery requiring re-intubation. He had developed respiratory acidosis and was placed on a high TV and RR on  the ventilator, he was also started on a bicarbonate drip. During intubation Dr. Molli KnockYacoub noted a large amount of pulmonary edema. Patient is currently intubated and was requiring high FiO2 and PEEP. We have slowly been trying to wean him off the propofol and fentanyl drips however when they are decreased he becomes very agitated. Started versed, seroquel, and oxycodone yesterday to try and wean him off the drips. He is still volume overloaded, we will continue diuresis. He gets very agitated and hypertensive when we wean him off the sedatives.  -Pulmonary hygiene  -CXR showed improvement in his pulmonary edema.  -Daily SBT, daily sedation vacation -Decrease fentanyl and propofol drip, attempting to decrease sedation to do a SBT -Continue oxycodone 10 q6 hr  -Continue versed 1-2 mg PRN -Continue seroquel 50 mg BID -Add klonopin 0.5 BID -Add hydralazine 5 mg q4hr PRN -Lasix 40 mg IV today -Monitor CVP  Shock: Most likely multifactorial in the setting of attempted diuresis, grade 1 diastolic dysfunction, dilated right ventricle, elevated right ventricle pressures made worse by propofol drip. -Not on any pressors at the moment -Cortisol level > 134 -Blood Cultures NGTD  AKI: Cr improved today to 2.19, down from 2.94 yesterday. Likely 2/2 pre-renal azotemia from over-diuresis.  -BMP in AM  Left ankle injury -S/p ORIF on 1/16 by Dr. Victorino DikeHewitt -Per ortho >> reinforce splint as needed, will need PT/OT as he becomes stable  DM: -SSI -Frequent CBGs  HTN: -Patient is on lisinopril at home, BP today was on the softer side and he had required a levoped drip.  -Hold Lisinopril -Can restart as necessary   Best practice:  Diet: Tube feeds Pain/Anxiety/Delirium protocol (if indicated): Propofol for RASS -1 to -2 VAP protocol (if indicated): Yes DVT prophylaxis: Heparin GI prophylaxis: PPI Glucose control: SSI Mobility: Bed ridden Code Status: Full Family Communication:  Disposition: Remain in  ICU  Labs   CBC: Recent Labs  Lab 2019-04-23 1655 09/16/18 0334 09/18/18 0439  WBC 12.7* 8.7 5.9  NEUTROABS 10.9*  --  4.3  HGB 16.4 14.6 13.0  HCT 59.1* 48.4 42.8  MCV 101.4* 95.3 95.5  PLT 289 203 201  3  Basic Metabolic Panel: Recent Labs  Lab 09/14/18 1200  2019-04-23 1655 09/16/18 0334 09/16/18 1400 09/16/18 1700 09/17/18 0436 09/17/18 1843 09/18/18 0439  NA 141  --  139 138  --   --  141  --  145  K 4.5  --  5.1 3.8  --   --  3.5  --  3.6  CL 103  --  99 100  --   --  102  --  108  CO2 30  --  31 27  --   --  28  --  25  GLUCOSE 98  --  242* 160*  --   --  137*  --  104*  BUN 12  --  15 21*  --   --  31*  --  43*  CREATININE 1.20  --  1.69* 2.24*  --   --  2.94*  --  2.19*  CALCIUM 8.8*  --  8.0* 7.6*  --   --  7.6*  --  7.8*  MG  --    < > 2.1 1.7 0.8* 1.7 1.9 2.0 2.3  PHOS  --    < > 7.7* 1.9* 1.5* 4.3 5.6* 4.4 3.8   < > = values in this interval not displayed.   GFR: Estimated Creatinine Clearance: 60.6 mL/min (A) (by C-G formula based on SCr of 2.19 mg/dL (H)). Recent Labs  Lab 10/17/2018 1655 10/02/2018 1715 03-Oct-2018 2200 09/16/18 0334 09/17/18 1058 09/18/18 0439 09/19/18 0406  PROCALCITON  --   --   --   --  13.59 7.05 5.32  WBC 12.7*  --   --  8.7  --  5.9  --   LATICACIDVEN  --  1.3 2.0*  --   --   --   --     Liver Function Tests: Recent Labs  Lab 10/20/2018 1655  AST 19  ALT 14  ALKPHOS 78  BILITOT 0.7  PROT 7.1  ALBUMIN 4.0   No results for input(s): LIPASE, AMYLASE in the last 168 hours. No results for input(s): AMMONIA in the last 168 hours.  ABG    Component Value Date/Time   PHART 7.446 09/18/2018 0330   PCO2ART 40.9 09/18/2018 0330   PO2ART 78.7 (L) 09/18/2018 0330   HCO3 27.7 09/18/2018 0330   TCO2 35 (H) 09/16/2018 1344   O2SAT 96.0 09/18/2018 0330     Coagulation Profile: No results for input(s): INR, PROTIME in the last 168 hours.  Cardiac Enzymes: Recent Labs  Lab Oct 03, 2018 1655  TROPONINI <0.03    HbA1C: No  results found for: HGBA1C  CBG: Recent Labs  Lab 09/18/18 2019 09/18/18 2105 09/18/18 2335 09/19/18 0346 09/19/18 0523  GLUCAP 65* 91 103* 70 74    Review of Systems:   Currently intubated and sedated  Past Medical History  He,  has a past medical history of Diabetes mellitus without complication (HCC), Hypertension, and Sleep apnea.   Surgical History    Past Surgical History:  Procedure Laterality Date  . APPENDECTOMY  1979  . ORIF ANKLE FRACTURE Left 10/14/2018   Procedure: Left fibular and tibial open treatment with internal fixation;  Surgeon: Toni Arthurs, MD;  Location: [facility];  Service: Orthopedics;  Laterality: Left;   Outpatient in bed     Social History   reports that he quit smoking about 8 weeks ago. His smoking use included cigarettes. He has never used smokeless tobacco. He reports previous alcohol use. He reports that he does not use drugs.   Family History   His family history is not on file.   Allergies No Known Allergies   Home Medications  Prior to Admission medications   Medication Sig Start Date End Date Taking? Authorizing Provider  glipiZIDE (GLUCOTROL) 5 MG tablet Take by mouth 2 (two) times daily before a meal.   Yes [provider]  lisinopril (PRINIVIL,ZESTRIL) 10 MG tablet Take 10 mg by mouth daily.   Yes [provider]  metFORMIN (GLUCOPHAGE) 500 MG tablet Take by mouth 2 (two) times daily with a meal.   Yes [provider]  aspirin EC 81 MG tablet Take 1 tablet (81 mg total) by mouth 2 (two) times daily. 10/28/2018   Jacinta Shoe, PA-C  docusate sodium (COLACE) 100 MG capsule Take 1 capsule (100 mg total) by mouth 2 (two) times daily. While taking narcotic pain medicine. Oct 03, 2018  Jacinta Shoellis, Justin Pike, PA-C  oxyCODONE (ROXICODONE) 5 MG immediate release tablet Take 1 tablet (5 mg total) by mouth every 4 (four) hours as needed for up to 5 days for severe pain. 11/13/18 09/20/18  Jacinta Shoellis,  Justin Pike, PA-C  senna (SENOKOT) 8.6 MG TABS tablet Take 2 tablets (17.2 mg total) by mouth 2 (two) times daily. 11/13/18   Jacinta Shoellis, Justin Pike, PA-C     Critical care time:     Claudean SeveranceMarissa M , M.D. PGY1 Pager [phone] 09/19/2018 6:59 AM

## 2018-09-19 NOTE — Progress Notes (Signed)
eLink Physician-Brief Progress Note Patient Name: Rodney Hoffman DOB: 09/12/1965 MRN: [id number]   Date of Service  09/19/2018  HPI/Events of Note  Pt has CVP 19-20.  He is hemodynamically stable.  He is getting IVFs@75ml /hr and other drips.  He is tolerating tube feeds.  eICU Interventions  Discontinue NS. Start free water boluses q shift.         Larinda Buttery 09/19/2018, 1:24 AM

## 2018-09-19 NOTE — Plan of Care (Signed)
  Problem: Education: Goal: Knowledge of General Education information will improve Description Including pain rating scale, medication(s)/side effects and non-pharmacologic comfort measures Outcome: Progressing   Problem: Health Behavior/Discharge Planning: Goal: Ability to manage health-related needs will improve Outcome: Progressing   Problem: Clinical Measurements: Goal: Ability to maintain clinical measurements within normal limits will improve Outcome: Progressing Goal: Will remain free from infection Outcome: Progressing Goal: Diagnostic test results will improve Outcome: Progressing Goal: Respiratory complications will improve Outcome: Progressing Goal: Cardiovascular complication will be avoided Outcome: Progressing   Problem: Activity: Goal: Risk for activity intolerance will decrease Outcome: Progressing   Problem: Nutrition: Goal: Adequate nutrition will be maintained Outcome: Progressing   Problem: Coping: Goal: Level of anxiety will decrease Outcome: Progressing   Problem: Elimination: Goal: Will not experience complications related to bowel motility Outcome: Progressing Goal: Will not experience complications related to urinary retention Outcome: Progressing   Problem: Pain Managment: Goal: General experience of comfort will improve Outcome: Progressing   Problem: Safety: Goal: Ability to remain free from injury will improve Outcome: Progressing   Problem: Skin Integrity: Goal: Risk for impaired skin integrity will decrease Outcome: Progressing   Problem: Activity: Goal: Ability to tolerate increased activity will improve Outcome: Progressing   Problem: Respiratory: Goal: Ability to maintain a clear airway and adequate ventilation will improve Outcome: Progressing   Problem: Role Relationship: Goal: Method of communication will improve Outcome: Progressing    Sedation decreased by 50% this AM with Vent compliance maintained. Foley pulled  this AM as well.

## 2018-09-20 LAB — CBC
HCT: 47 % (ref 39.0–52.0)
Hemoglobin: 13.4 g/dL (ref 13.0–17.0)
MCH: 27.9 pg (ref 26.0–34.0)
MCHC: 28.5 g/dL — ABNORMAL LOW (ref 30.0–36.0)
MCV: 97.7 fL (ref 80.0–100.0)
Platelets: 199 10*3/uL (ref 150–400)
RBC: 4.81 MIL/uL (ref 4.22–5.81)
RDW: 15.7 % — ABNORMAL HIGH (ref 11.5–15.5)
WBC: 6.6 10*3/uL (ref 4.0–10.5)
nRBC: 0 % (ref 0.0–0.2)

## 2018-09-20 LAB — BASIC METABOLIC PANEL
Anion gap: 6 (ref 5–15)
BUN: 47 mg/dL — ABNORMAL HIGH (ref 6–20)
CO2: 28 mmol/L (ref 22–32)
Calcium: 8.6 mg/dL — ABNORMAL LOW (ref 8.9–10.3)
Chloride: 111 mmol/L (ref 98–111)
Creatinine, Ser: 2.17 mg/dL — ABNORMAL HIGH (ref 0.61–1.24)
GFR calc Af Amer: 39 mL/min — ABNORMAL LOW (ref >60)
GFR calc non Af Amer: 33 mL/min — ABNORMAL LOW (ref >60)
Glucose, Bld: 146 mg/dL — ABNORMAL HIGH (ref 70–99)
POTASSIUM: 3.9 mmol/L (ref 3.5–5.1)
Sodium: 145 mmol/L (ref 135–145)

## 2018-09-20 LAB — CULTURE, BLOOD (ROUTINE X 2): Special Requests: ADEQUATE

## 2018-09-20 LAB — GLUCOSE, CAPILLARY
Glucose-Capillary: 135 mg/dL — ABNORMAL HIGH (ref 70–99)
Glucose-Capillary: 132 mg/dL — ABNORMAL HIGH (ref 70–99)
Glucose-Capillary: 114 mg/dL — ABNORMAL HIGH (ref 70–99)
Glucose-Capillary: 177 mg/dL — ABNORMAL HIGH (ref 70–99)
Glucose-Capillary: 143 mg/dL — ABNORMAL HIGH (ref 70–99)

## 2018-09-20 MED ORDER — AMLODIPINE BESYLATE 5 MG PO TABS
5.0000 mg | ORAL_TABLET | Freq: Every day | ORAL | Status: DC
  Administered 2018-09-20 – 2018-09-23 (×3): 5 mg via ORAL
  Filled 2018-09-20 (×4): qty 1

## 2018-09-20 MED ORDER — FUROSEMIDE 10 MG/ML IJ SOLN
80.0000 mg | Freq: Every day | INTRAMUSCULAR | Status: DC
  Administered 2018-09-20 – 2018-09-21 (×2): 80 mg via INTRAVENOUS
  Filled 2018-09-20 (×2): qty 8

## 2018-09-20 MED ORDER — FUROSEMIDE 10 MG/ML IJ SOLN
80.0000 mg | Freq: Once | INTRAMUSCULAR | Status: AC
  Administered 2018-09-20: 80 mg via INTRAVENOUS
  Filled 2018-09-20: qty 8

## 2018-09-20 NOTE — Progress Notes (Signed)
Subjective: 5 Days Post-Op Procedure(s) (LRB): Left fibular and tibial open treatment with internal fixation (Left)  Patient intubated, but alert and able to nod head in response to questions.  Denies any pain in L LE.  Nurse reports that restraints are required due to patient agitation coming out of sedation.  Objective:   VITALS:  Temp:  [100 F (37.8 C)-101.2 F (38.4 C)] 100 F (37.8 C) (01/21 0320) Pulse Rate:  [29-143] 102 (01/21 0733) Resp:  [15-22] 15 (01/21 0600) BP: (116-209)/(60-101) 127/85 (01/21 0600) SpO2:  [95 %-100 %] 100 % (01/21 0600) Arterial Line BP: (115-280)/(54-130) 149/79 (01/21 0600) FiO2 (%):  [40 %] 40 % (01/21 0733) Weight:  [159.1 kg] 159.1 kg (01/21 0404)  General: WDWN patient in NAD. Psych:  Anxious Neuro:  A&O x 3, Moving all extremities, sensation intact to light touch HEENT:  EOMs intact Chest:  Even non-labored respirations on mechanical vent Skin: SLS C/D/I, no rashes or lesions Extremities: warm/dry, no visible edema, erythema or echymosis.  No lymphadenopathy. Pulses: Popliteus 2+ MSK:  ROM: EHL/FHL intact, MMT: able to perform quad set, (-) Homan's    LABS Recent Labs    09/18/18 0439 09/20/18 0419  HGB 13.0 13.4  WBC 5.9 6.6  PLT 201 199   Recent Labs    09/19/18 0904 09/20/18 0419  NA 146* 145  K 3.8 3.9  CL 111 111  CO2 25 28  BUN 51* 47*  CREATININE 2.19* 2.17*  GLUCOSE 126* 146*   No results for input(s): LABPT, INR in the last 72 hours.   Assessment/Plan: 5 Days Post-Op Procedure(s) (LRB): Left fibular and tibial open treatment with internal fixation (Left)  NWB L LE Reinforce splint prn CCM management greatly appreciated.  Alfredo Martinez PA-C [facility] Office:  [phone]  Florentina Addison [facility] 09/20/2018, 7:49 AM

## 2018-09-20 NOTE — Progress Notes (Signed)
eLink Physician-Brief Progress Note Patient Name: Rodney Hoffman DOB: 04/09/1966 MRN: [id number]   Date of Service  09/20/2018  HPI/Events of Note  Patient diuresed at 10 AM for CVP = 24. CVP now = 19. BP = 148/78.   eICU Interventions  Will order: 1. Lasix 80 mg IV X 1 now.      Intervention Category Major Interventions: Other:  Lenell Antu 09/20/2018, 9:38 PM

## 2018-09-20 NOTE — Progress Notes (Signed)
NAME:  Rodney Hoffman, MRN:  [id number], DOB:  07/21/1966, LOS: 5 ADMISSION DATE:  09/22/2018, CONSULTATION DATE:  1/16 REFERRING MD:  Dr. Victorino DikeHewitt, CHIEF COMPLAINT:  Post op respiratory failure  Brief History   This is a 53 year old male with a history of HTN, OSA and DM who after getting a ORIF of his left fibular and tibial developed hypoxic and hypercarbic respiratory failure requiring intubation and transfer to [facility] and the ICU for airway management.  History of present illness   53 year old male with past medical history as below, which is significant for tobacco abuse and diabetes.  The patient is encephalopathic and therefore history is obtained largely from chart review.  He suffered a left ankle fracture about a year ago and after some lifestyle modification and better management of his diabetes he presented on 1/16 to the outpatient surgery center for left ankle ORIF under Dr. Victorino DikeHewitt.  He was extubated immediately postoperatively, however, he then developed hypoxemia with sats down into the low 80s and altered mental status progressing to obtundation.  He was transferred to [facility] and [facility] for airway management.  He was given 120 mg IV lasix and had poor output, he became profoundly hypotensive. Bedside u/s showed dilated RV and hyperdynamic LV. Cardiology called and patient was started on levophed drip. He initially had respiratory acidosis and was started on bicarbonate drip, follow up ABG showed respiratory alkalosis so bicarbonate drip was stopped.   Past Medical History   Past Medical History:  Diagnosis Date  . Diabetes mellitus without complication (HCC)   . Hypertension   . Sleep apnea    does not wear CPAP     Significant Hospital Events   1/16 ORIF L ankle. Failed extubation. Transfer to [facility]  Consults:    Procedures:  1/16 L ankle ORIF (Dr. Victorino DikeHewitt) 1/16 ETT  Significant Diagnostic Tests:   Echocardiogram 09/16/18 Impressions:  - Normal LV size  with moderate LV hypertrophy. EF 50-55%, low   normal to mildly decreased. Basal inferior akinesis. Mildly   dilated RV with mild to moderately decreased systolic function.   Dilated IVC suggestive of elevated RV filling pressure. Micro Data:    Antimicrobials:     Interim history/subjective:  Patient is currently intubated and sedated, no family at bedside.  Off all pressors.   Objective   Blood pressure 127/85, pulse 66, temperature 100 F (37.8 C), temperature source Oral, resp. rate 15, height 6\' 1"  (1.854 m), weight (!) 159.1 kg, SpO2 100 %. CVP:  [9 mmHg-25 mmHg] 21 mmHg  Vent Mode: PRVC FiO2 (%):  [40 %] 40 % Set Rate:  [15 bmp-20 bmp] 15 bmp Vt Set:  [550 mL] 550 mL PEEP:  [10 cmH20] 10 cmH20 Plateau Pressure:  [21 cmH20-28 cmH20] 21 cmH20   Intake/Output Summary (Last 24 hours) at 09/20/2018 [id number] Last data filed at 09/20/2018 0600 Gross per 24 hour  Intake 3320.48 ml  Output 2955 ml  Net 365.48 ml   Filed Weights   09/18/18 0500 09/19/18 0400 09/20/18 0404  Weight: (!) 156.5 kg (!) 158 kg (!) 159.1 kg    Examination: General: Intubated, left central line and left arterial line in place, obese male, following commands HENT: Normocephalic, atraumatic Lungs: Minimal bibasilar crackles, no increased work of breathing  Cardiovascular: RRR, no m/r/g Abdomen: soft, no guarding Extremities: Left leg in splint, trace edema Neuro: Sedated GU: Foley in place  Resolved Hospital Problem list     Assessment & Plan:  Acute hypoxic respiratory failure:  Currently intubated: Respiratory acidosis: Patient becomes very agitated and severely hypertensive when we try to wean him off the propofol and fentanyl drip. He is on oxycodone, versed, seroquel and we added klonopin and hydralazine yesterday. We also started him on a precedex drip to help with the weaning however he still becomes significantly agitated and hypertensive. We had been unable to do a SBT due to his  sedation. However patient still appears volume overloaded on exam and has been net positive for his fluid balance. CVP elevated to 24 today.  -Pulmonary hygiene  -CXR showed improvement in his pulmonary edema.  -Daily SBT, daily sedation vacation -Decrease fentanyl and propofol drip, attempting to decrease sedation to do a SBT -Continue oxycodone 10 q6 hr  -Continue versed 1-2 mg PRN -Continue seroquel 50 mg BID -Continue klonopin 0.5 BID -Continue hydralazine 5 mg q4hr PRN -Lasix 80 mg IV today, then 40 mg BID starting tomorrow -Monitor CVP  Shock: Most likely multifactorial in the setting of attempted diuresis, grade 1 diastolic dysfunction, dilated right ventricle, elevated right ventricle pressures made worse by propofol drip. -Not on any pressors at the moment -Cortisol level > 134 -Blood Cultures > strep viridans growing in 1/4 bottles, 2 bottles have not come back yet. It's possible that this is a contaminant. He did have a fever last night up to 101.2 however no leukocytosis. Will not start any antibiotics at this time and follow up on blood culture results.   AKI: Cr improved today to 2.17, down from 2.94 yesterday. Likely 2/2 pre-renal azotemia from over-diuresis.  -BMP in AM  Left ankle injury -S/p ORIF on 1/16 by Dr. Victorino Dike -Per ortho >> reinforce splint as needed, will need PT/OT as he becomes stable  DM: -SSI -Frequent CBGs  HTN: -Patient is on lisinopril at home, BP today was on the softer side and he had required a levoped drip.  -Hold Lisinopril -Can restart as necessary   Best practice:  Diet: Tube feeds Pain/Anxiety/Delirium protocol (if indicated): Propofol for RASS -1 to -2 VAP protocol (if indicated): Yes DVT prophylaxis: Heparin GI prophylaxis: PPI Glucose control: SSI Mobility: Bed ridden Code Status: Full Family Communication:  Disposition: Remain in ICU  Labs   CBC: Recent Labs  Lab 09/23/2018 1655 09/16/18 0334 09/18/18 0439 09/20/18 0419   WBC 12.7* 8.7 5.9 6.6  NEUTROABS 10.9*  --  4.3  --   HGB 16.4 14.6 13.0 13.4  HCT 59.1* 48.4 42.8 47.0  MCV 101.4* 95.3 95.5 97.7  PLT 289 203 201 199  3  Basic Metabolic Panel: Recent Labs  Lab 09/16/18 0334 09/16/18 1400 09/16/18 1700 09/17/18 0436 09/17/18 1843 09/18/18 0439 09/19/18 0904 09/20/18 0419  NA 138  --   --  141  --  145 146* 145  K 3.8  --   --  3.5  --  3.6 3.8 3.9  CL 100  --   --  102  --  108 111 111  CO2 27  --   --  28  --  25 25 28   GLUCOSE 160*  --   --  137*  --  104* 126* 146*  BUN 21*  --   --  31*  --  43* 51* 47*  CREATININE 2.24*  --   --  2.94*  --  2.19* 2.19* 2.17*  CALCIUM 7.6*  --   --  7.6*  --  7.8* 8.2* 8.6*  MG 1.7 0.8* 1.7 1.9 2.0  2.3  --   --   PHOS 1.9* 1.5* 4.3 5.6* 4.4 3.8  --   --    GFR: Estimated Creatinine Clearance: 61.4 mL/min (A) (by C-G formula based on SCr of 2.17 mg/dL (H)). Recent Labs  Lab 09/26/2018 1655 09/08/2018 1715 09/19/2018 2200 09/16/18 0334 09/17/18 1058 09/18/18 0439 09/19/18 0406 09/20/18 0419  PROCALCITON  --   --   --   --  13.59 7.05 5.32  --   WBC 12.7*  --   --  8.7  --  5.9  --  6.6  LATICACIDVEN  --  1.3 2.0*  --   --   --   --   --     Liver Function Tests: Recent Labs  Lab 09/26/2018 1655  AST 19  ALT 14  ALKPHOS 78  BILITOT 0.7  PROT 7.1  ALBUMIN 4.0   No results for input(s): LIPASE, AMYLASE in the last 168 hours. No results for input(s): AMMONIA in the last 168 hours.  ABG    Component Value Date/Time   PHART 7.446 09/18/2018 0330   PCO2ART 40.9 09/18/2018 0330   PO2ART 78.7 (L) 09/18/2018 0330   HCO3 27.7 09/18/2018 0330   TCO2 35 (H) 09/16/2018 1344   O2SAT 96.0 09/18/2018 0330     Coagulation Profile: No results for input(s): INR, PROTIME in the last 168 hours.  Cardiac Enzymes: Recent Labs  Lab 09/30/2018 1655  TROPONINI <0.03    HbA1C: No results found for: HGBA1C  CBG: Recent Labs  Lab 09/19/18 1135 09/19/18 1507 09/19/18 2004 09/19/18 2320  09/20/18 0352  GLUCAP 100* 117* 120* 140* 135*    Review of Systems:   Currently intubated and sedated  Past Medical History  He,  has a past medical history of Diabetes mellitus without complication (HCC), Hypertension, and Sleep apnea.   Surgical History    Past Surgical History:  Procedure Laterality Date  . APPENDECTOMY  1979  . ORIF ANKLE FRACTURE Left 09/21/2018   Procedure: Left fibular and tibial open treatment with internal fixation;  Surgeon: Toni ArthursHewitt, John, MD;  Location: [facility];  Service: Orthopedics;  Laterality: Left;  120min  Outpatient in bed     Social History   reports that he quit smoking about 1 months ago. His smoking use included cigarettes. He has never used smokeless tobacco. He reports previous alcohol use. He reports that he does not use drugs.   Family History   His family history is not on file.   Allergies No Known Allergies   Home Medications  Prior to Admission medications   Medication Sig Start Date End Date Taking? Authorizing Provider  glipiZIDE (GLUCOTROL) 5 MG tablet Take by mouth 2 (two) times daily before a meal.   Yes [provider]  lisinopril (PRINIVIL,ZESTRIL) 10 MG tablet Take 10 mg by mouth daily.   Yes [provider]  metFORMIN (GLUCOPHAGE) 500 MG tablet Take by mouth 2 (two) times daily with a meal.   Yes [provider]  aspirin EC 81 MG tablet Take 1 tablet (81 mg total) by mouth 2 (two) times daily. 09/12/2018   Jacinta Shoellis, Justin Pike, PA-C  docusate sodium (COLACE) 100 MG capsule Take 1 capsule (100 mg total) by mouth 2 (two) times daily. While taking narcotic pain medicine. 09/14/2018   Jacinta Shoellis, Justin Pike, PA-C  oxyCODONE (ROXICODONE) 5 MG immediate release tablet Take 1 tablet (5 mg total) by mouth every 4 (four) hours as needed for up to 5  days for severe pain. 09/16/2018 09/20/18  Jacinta Shoe, PA-C  senna (SENOKOT) 8.6 MG TABS tablet Take 2 tablets (17.2 mg total) by mouth 2 (two) times  daily. 09/17/2018   Jacinta Shoe, PA-C     Critical care time:     Claudean Severance, M.D. PGY1 Pager [phone] 09/20/2018 6:52 AM

## 2018-09-20 NOTE — Progress Notes (Signed)
RT note: Upon arrival to patient room to perform AM assessment, patient's aline pressure noted to be reading 215/107.  Will hold on SBT.

## 2018-09-21 ENCOUNTER — Inpatient Hospital Stay (HOSPITAL_COMMUNITY): Payer: No Typology Code available for payment source

## 2018-09-21 LAB — BASIC METABOLIC PANEL
Anion gap: 11 (ref 5–15)
BUN: 50 mg/dL — ABNORMAL HIGH (ref 6–20)
CALCIUM: 8.9 mg/dL (ref 8.9–10.3)
CO2: 28 mmol/L (ref 22–32)
Chloride: 108 mmol/L (ref 98–111)
Creatinine, Ser: 2.15 mg/dL — ABNORMAL HIGH (ref 0.61–1.24)
GFR calc Af Amer: 39 mL/min — ABNORMAL LOW (ref >60)
GFR calc non Af Amer: 34 mL/min — ABNORMAL LOW (ref >60)
Glucose, Bld: 158 mg/dL — ABNORMAL HIGH (ref 70–99)
Potassium: 3.8 mmol/L (ref 3.5–5.1)
Sodium: 147 mmol/L — ABNORMAL HIGH (ref 135–145)

## 2018-09-21 LAB — CBC WITH DIFFERENTIAL/PLATELET
Abs Immature Granulocytes: 0.05 10*3/uL (ref 0.00–0.07)
BASOS PCT: 1 %
Basophils Absolute: 0 10*3/uL (ref 0.0–0.1)
EOS ABS: 0.2 10*3/uL (ref 0.0–0.5)
Eosinophils Relative: 3 %
HCT: 43.9 % (ref 39.0–52.0)
Hemoglobin: 12.9 g/dL — ABNORMAL LOW (ref 13.0–17.0)
Immature Granulocytes: 1 %
Lymphocytes Relative: 17 %
Lymphs Abs: 1.1 10*3/uL (ref 0.7–4.0)
MCH: 28.5 pg (ref 26.0–34.0)
MCHC: 29.4 g/dL — ABNORMAL LOW (ref 30.0–36.0)
MCV: 96.9 fL (ref 80.0–100.0)
Monocytes Absolute: 0.8 10*3/uL (ref 0.1–1.0)
Monocytes Relative: 13 %
Neutro Abs: 4.4 10*3/uL (ref 1.7–7.7)
Neutrophils Relative %: 65 %
Platelets: 228 10*3/uL (ref 150–400)
RBC: 4.53 MIL/uL (ref 4.22–5.81)
RDW: 15.1 % (ref 11.5–15.5)
WBC: 6.6 10*3/uL (ref 4.0–10.5)
nRBC: 0 % (ref 0.0–0.2)

## 2018-09-21 LAB — GLUCOSE, CAPILLARY
Glucose-Capillary: 110 mg/dL — ABNORMAL HIGH (ref 70–99)
Glucose-Capillary: 123 mg/dL — ABNORMAL HIGH (ref 70–99)
Glucose-Capillary: 131 mg/dL — ABNORMAL HIGH (ref 70–99)
Glucose-Capillary: 132 mg/dL — ABNORMAL HIGH (ref 70–99)
Glucose-Capillary: 139 mg/dL — ABNORMAL HIGH (ref 70–99)
Glucose-Capillary: 141 mg/dL — ABNORMAL HIGH (ref 70–99)

## 2018-09-21 LAB — MAGNESIUM: Magnesium: 2.5 mg/dL — ABNORMAL HIGH (ref 1.7–2.4)

## 2018-09-21 MED ORDER — SODIUM CHLORIDE 0.9 % IV SOLN
2.0000 g | INTRAVENOUS | Status: DC
  Administered 2018-09-21 – 2018-09-26 (×6): 2 g via INTRAVENOUS
  Filled 2018-09-21 (×6): qty 20

## 2018-09-21 MED ORDER — VANCOMYCIN HCL 10 G IV SOLR
2500.0000 mg | Freq: Once | INTRAVENOUS | Status: DC
  Filled 2018-09-21: qty 2500

## 2018-09-21 MED ORDER — FUROSEMIDE 10 MG/ML IJ SOLN
40.0000 mg | Freq: Two times a day (BID) | INTRAMUSCULAR | Status: DC
  Administered 2018-09-21 – 2018-09-24 (×6): 40 mg via INTRAVENOUS
  Filled 2018-09-21 (×6): qty 4

## 2018-09-21 MED ORDER — SODIUM CHLORIDE 0.9 % IV SOLN
INTRAVENOUS | Status: DC | PRN
  Administered 2018-09-21: 250 mL via INTRAVENOUS
  Administered 2018-09-30: 500 mL via INTRAVENOUS
  Administered 2018-10-02: 250 mL via INTRAVENOUS

## 2018-09-21 MED ORDER — QUETIAPINE FUMARATE 100 MG PO TABS
100.0000 mg | ORAL_TABLET | Freq: Two times a day (BID) | ORAL | Status: DC
  Administered 2018-09-21 – 2018-09-23 (×6): 100 mg
  Filled 2018-09-21 (×6): qty 1

## 2018-09-21 MED ORDER — VANCOMYCIN HCL 10 G IV SOLR: 1500.0000 mg | INTRAVENOUS | Status: DC

## 2018-09-21 NOTE — Progress Notes (Addendum)
Pharmacy Antibiotic Note  Rodney Hoffman is a 53 y.o. male admitted on 2018/10/07 for ORIF surgery, intubated due to postoperative respiratory failure s/p surgery, now with suspected bacteremia. Patient is growing 1/4 blood culture bottles with strept viridans, susceptible to vancomycin/clindamycin. This blood culture is thought to be a contaminant, repeat cultures have been drawn and pharmacy has been consulted for vancomycin dosing. Patient has been febrile with tMax 102 in the last 24 hours. Patient has had a stable but downward trending Scr, most recent at 2.15; baseline around 1.20. Wbc initially elevated on admission but now WNL.   Plan: Vancomycin 2500mg  IV load dose x1 now followed by Vancomycin 1500mg  IV every 24 hours. Goal AUC: 400-500 Expected AUC 475.1 Scr Used: 2.15 Will f/u renal function, repeat blood cultures, and patients clinical condition.  Vancomycin levels as needed  ADDENDUM: Change of therapy from vancomycin to ceftriaxone 2g every 24h    Height: 6\' 1"  (185.4 cm) Weight: (!) 337 lb 8.4 oz (153.1 kg) IBW/kg (Calculated) : 79.9  Temp (24hrs), Avg:100.9 F (38.3 C), Min:99.8 F (37.7 C), Max:102 F (38.9 C)  Recent Labs  Lab 10/07/18 1655 2018-10-07 1715 10/07/18 2200 09/16/18 0334 09/17/18 0436 09/18/18 0439 09/19/18 0904 09/20/18 0419 09/21/18 0345  WBC 12.7*  --   --  8.7  --  5.9  --  6.6 6.6  CREATININE 1.69*  --   --  2.24* 2.94* 2.19* 2.19* 2.17* 2.15*  LATICACIDVEN  --  1.3 2.0*  --   --   --   --   --   --     Estimated Creatinine Clearance: 60.7 mL/min (A) (by C-G formula based on SCr of 2.15 mg/dL (H)).    No Known Allergies  Antimicrobials this admission: 1/22 Vancomycin >> not given, changed to ceftriaxone 1/22 ceftriaxone >>   Dose adjustments this admission:   Microbiology results: 1/18 BCx: Viridans streptococcus (1/4 bottles) (sens to vanc, clinda and ceftriaxone [per call to micro]) 1/22 repeat BCx: SENT 1/16 MRSA PCR:  negative  Thank you for allowing pharmacy to be a part of this patient's care.  Bradley Ferris, PharmD 09/21/2018 10:30 AM PGY-1 Pharmacy Resident Direct Phone: [phone] Please check [email or web address] for unit-specific pharmacist phone numbers

## 2018-09-21 NOTE — Progress Notes (Signed)
NAME:  Rodney Hoffman, MRN:  [id number], DOB:  07/09/1966, LOS: 6 ADMISSION DATE:  09/13/2018, CONSULTATION DATE:  1/16 REFERRING MD:  Dr. Victorino DikeHewitt, CHIEF COMPLAINT:  Post op respiratory failure  Brief History   This is a 53 year old male with a history of HTN, OSA and DM who after getting a ORIF of his left fibular and tibial developed hypoxic and hypercarbic respiratory failure requiring intubation and transfer to [facility] and the ICU for airway management.  History of present illness   53 year old male with past medical history as below, which is significant for tobacco abuse and diabetes.  The patient is encephalopathic and therefore history is obtained largely from chart review.  He suffered a left ankle fracture about a year ago and after some lifestyle modification and better management of his diabetes he presented on 1/16 to the outpatient surgery center for left ankle ORIF under Dr. Victorino DikeHewitt.  He was extubated immediately postoperatively, however, he then developed hypoxemia with sats down into the low 80s and altered mental status progressing to obtundation.  He was transferred to [facility] and [facility] for airway management.  He was given 120 mg IV lasix and had poor output, he became profoundly hypotensive. Bedside u/s showed dilated RV and hyperdynamic LV. Cardiology called and patient was started on levophed drip. He initially had respiratory acidosis and was started on bicarbonate drip, follow up ABG showed respiratory alkalosis so bicarbonate drip was stopped.    Past Medical History   Past Medical History:  Diagnosis Date  . Diabetes mellitus without complication (HCC)   . Hypertension   . Sleep apnea    does not wear CPAP     Significant Hospital Events   1/16 ORIF L ankle. Failed extubation. Transfer to [facility]  Consults:    Procedures:  1/16 L ankle ORIF (Dr. Victorino DikeHewitt) 1/16 ETT  Significant Diagnostic Tests:   Echocardiogram 09/16/18 Impressions:  - Normal LV size  with moderate LV hypertrophy. EF 50-55%, low   normal to mildly decreased. Basal inferior akinesis. Mildly   dilated RV with mild to moderately decreased systolic function.   Dilated IVC suggestive of elevated RV filling pressure.  CT head 09/21/18   IMPRESSION: No acute intracranial normality.  Pansinus disease and bilateral mastoid effusions. Micro Data:    Antimicrobials:     Interim history/subjective:  Patient is currently intubated and sedated, no family at bedside. Propofol was decreased and he was following commands however while I was there he became very agitated, started sweating, and his blood pressure increased.    Objective   Blood pressure 108/63, pulse 78, temperature (!) 102 F (38.9 C), temperature source Oral, resp. rate 15, height 6\' 1"  (1.854 m), weight (!) 153.1 kg, SpO2 100 %. CVP:  [20 mmHg-24 mmHg] 20 mmHg  Vent Mode: CPAP;PSV FiO2 (%):  [40 %] 40 % Set Rate:  [15 bmp] 15 bmp Vt Set:  [550 mL] 550 mL PEEP:  [8 cmH20] 8 cmH20 Pressure Support:  [14 cmH20] 14 cmH20 Plateau Pressure:  [19 cmH20-27 cmH20] 19 cmH20   Intake/Output Summary (Last 24 hours) at 09/21/2018 0846 Last data filed at 09/21/2018 0600 Gross per 24 hour  Intake 3119.39 ml  Output 8710 ml  Net -5590.61 ml   Filed Weights   09/19/18 0400 09/20/18 0404 09/21/18 0500  Weight: (!) 158 kg (!) 159.1 kg (!) 153.1 kg    Examination: General: Intubated, left central line and left arterial line in place, obese male, following commands  HENT: Normocephalic, atraumatic Lungs: Minimal bibasilar crackles, no increased work of breathing  Cardiovascular: RRR, no m/r/g Abdomen: soft, no guarding Extremities: Left leg in splint, trace edema Neuro: Sedated GU: Foley in place  Resolved Hospital Problem list     Assessment & Plan:   Acute hypoxic respiratory failure:  Currently intubated: Respiratory acidosis: Patient becomes very agitated and severely hypertensive when we try to wean  him off the propofol and fentanyl drip. He is on oxycodone, versed, seroquel and we added klonopin and hydralazine yesterday. We also started him on a precedex drip to help with the weaning however he still becomes significantly agitated and hypertensive. Off all pressors this morning. When trying to wean of propofol patient becomes very agitated, starts sweating, and his blood pressure increases dramatically. He has been able breath spontaneously but he is taking in very low tidal volumes, around 240.  -He has been having fevers for the past 2 days. 1/4 blood cultures did come back positive for strep viridans, initially thought to be a contaminant however given the significant agitation and continuing fevers we will repeat blood cultures and start antibiotics for a possible bacteremia.  -Repeat CT head  >> No acute findings, pansinusitis and bilateral mastoid effusions -Start ceftriaxone per pharmacy for possible bacteremia.  -Pulmonary hygiene  -CXR showed improvement in his pulmonary edema.  -Daily SBT, daily sedation vacation -Decrease fentanyl and propofol drip, attempting to decrease sedation to do a SBT -Continue oxycodone 10 q6 hr  -Continue versed 1-2 mg PRN -Increase seroquel to 100 mg BID -Continue klonopin 0.5 BID -Continue hydralazine 5 mg q4hr PRN -Lasix 40 mg BID  -Monitor CVP  Shock: Most likely multifactorial in the setting of attempted diuresis, grade 1 diastolic dysfunction, dilated right ventricle, elevated right ventricle pressures made worse by propofol drip. -Not on any pressors at the moment -Cortisol level > 134 -Blood Cultures > strep viridans growing in 1/4 bottles,   AKI: Cr improved today to 2.17, down from 2.94 yesterday. Likely 2/2 pre-renal azotemia from over-diuresis.  -BMP in AM  Left ankle injury -S/p ORIF on 1/16 by Dr. Victorino Dike -Per ortho >> reinforce splint as needed, will need PT/OT as he becomes stable. No evidence of lymphedema or streaking up left  leg.   DM: -SSI -Frequent CBGs  HTN: -Patient is on lisinopril at home, BP today was on the softer side and he had required a levoped drip.  -Hold Lisinopril -Can restart as necessary   Best practice:  Diet: Tube feeds Pain/Anxiety/Delirium protocol (if indicated): Propofol for RASS -1 to -2 VAP protocol (if indicated): Yes DVT prophylaxis: Heparin GI prophylaxis: PPI Glucose control: SSI Mobility: Bed ridden Code Status: Full Family Communication:  Disposition: Remain in ICU  Labs   CBC: Recent Labs  Lab October 15, 2018 1655 09/16/18 0334 09/18/18 0439 09/20/18 0419 09/21/18 0345  WBC 12.7* 8.7 5.9 6.6 6.6  NEUTROABS 10.9*  --  4.3  --  4.4  HGB 16.4 14.6 13.0 13.4 12.9*  HCT 59.1* 48.4 42.8 47.0 43.9  MCV 101.4* 95.3 95.5 97.7 96.9  PLT 289 203 201 199 228  3  Basic Metabolic Panel: Recent Labs  Lab 09/16/18 1400 09/16/18 1700 09/17/18 0436 09/17/18 1843 09/18/18 0439 09/19/18 0904 09/20/18 0419 09/21/18 0345  NA  --   --  141  --  145 146* 145 147*  K  --   --  3.5  --  3.6 3.8 3.9 3.8  CL  --   --  102  --  108 111 111 108  CO2  --   --  28  --  25 25 28 28   GLUCOSE  --   --  137*  --  104* 126* 146* 158*  BUN  --   --  31*  --  43* 51* 47* 50*  CREATININE  --   --  2.94*  --  2.19* 2.19* 2.17* 2.15*  CALCIUM  --   --  7.6*  --  7.8* 8.2* 8.6* 8.9  MG 0.8* 1.7 1.9 2.0 2.3  --   --  2.5*  PHOS 1.5* 4.3 5.6* 4.4 3.8  --   --   --    GFR: Estimated Creatinine Clearance: 60.7 mL/min (A) (by C-G formula based on SCr of 2.15 mg/dL (H)). Recent Labs  Lab 09/12/2018 1715 09/14/2018 2200 09/16/18 0334 09/17/18 1058 09/18/18 0439 09/19/18 0406 09/20/18 0419 09/21/18 0345  PROCALCITON  --   --   --  13.59 7.05 5.32  --   --   WBC  --   --  8.7  --  5.9  --  6.6 6.6  LATICACIDVEN 1.3 2.0*  --   --   --   --   --   --     Liver Function Tests: Recent Labs  Lab 09/23/2018 1655  AST 19  ALT 14  ALKPHOS 78  BILITOT 0.7  PROT 7.1  ALBUMIN 4.0   No  results for input(s): LIPASE, AMYLASE in the last 168 hours. No results for input(s): AMMONIA in the last 168 hours.  ABG    Component Value Date/Time   PHART 7.446 09/18/2018 0330   PCO2ART 40.9 09/18/2018 0330   PO2ART 78.7 (L) 09/18/2018 0330   HCO3 27.7 09/18/2018 0330   TCO2 35 (H) 09/16/2018 1344   O2SAT 96.0 09/18/2018 0330     Coagulation Profile: No results for input(s): INR, PROTIME in the last 168 hours.  Cardiac Enzymes: Recent Labs  Lab 09/08/2018 1655  TROPONINI <0.03    HbA1C: No results found for: HGBA1C  CBG: Recent Labs  Lab 09/20/18 1622 09/20/18 2051 09/20/18 2358 09/21/18 0403 09/21/18 0736  GLUCAP 177* 143* 131* 132* 123*    Review of Systems:   Currently intubated and sedated  Past Medical History  He,  has a past medical history of Diabetes mellitus without complication (HCC), Hypertension, and Sleep apnea.   Surgical History    Past Surgical History:  Procedure Laterality Date  . APPENDECTOMY  1979  . ORIF ANKLE FRACTURE Left 09/28/2018   Procedure: Left fibular and tibial open treatment with internal fixation;  Surgeon: Toni Arthurs, MD;  Location: [facility];  Service: Orthopedics;  Laterality: Left;   Outpatient in bed     Social History   reports that he quit smoking about 2 months ago. His smoking use included cigarettes. He has never used smokeless tobacco. He reports previous alcohol use. He reports that he does not use drugs.   Family History   His family history is not on file.   Allergies No Known Allergies   Home Medications  Prior to Admission medications   Medication Sig Start Date End Date Taking? Authorizing Provider  glipiZIDE (GLUCOTROL) 5 MG tablet Take by mouth 2 (two) times daily before a meal.   Yes [provider]  lisinopril (PRINIVIL,ZESTRIL) 10 MG tablet Take 10 mg by mouth daily.   Yes [provider]  metFORMIN (GLUCOPHAGE) 500 MG tablet Take by mouth 2 (two)  times daily with a meal.   Yes [provider]  aspirin EC 81 MG tablet Take 1 tablet (81 mg total) by mouth 2 (two) times daily. 09/01/2018   Jacinta Shoellis, Justin Pike, PA-C  docusate sodium (COLACE) 100 MG capsule Take 1 capsule (100 mg total) by mouth 2 (two) times daily. While taking narcotic pain medicine. 09/10/2018   Jacinta Shoellis, Justin Pike, PA-C  oxyCODONE (ROXICODONE) 5 MG immediate release tablet Take 1 tablet (5 mg total) by mouth every 4 (four) hours as needed for up to 5 days for severe pain. 09/02/2018 09/20/18  Jacinta Shoellis, Justin Pike, PA-C  senna (SENOKOT) 8.6 MG TABS tablet Take 2 tablets (17.2 mg total) by mouth 2 (two) times daily. 09/10/2018   Jacinta Shoellis, Justin Pike, PA-C     Critical care time:     Claudean SeveranceMarissa M Kanika Bungert, M.D. PGY1 Pager [phone] 09/21/2018 8:46 AM

## 2018-09-21 NOTE — Progress Notes (Signed)
eLink Physician-Brief Progress Note Patient Name: Rodney Hoffman DOB: 1966/01/19 MRN: [id number]   Date of Service  09/21/2018  HPI/Events of Note  No AM labs ordered.   eICU Interventions  Will order: 1. CBC with platelets, BMP and Mg++ level in AM.      Intervention Category Major Interventions: Other:  Lenell Antu 09/21/2018, 3:42 AM

## 2018-09-21 NOTE — Progress Notes (Signed)
Subjective: 6 Days Post-Op Procedure(s) (LRB): Left fibular and tibial open treatment with internal fixation (Left)  Patient patient remains on mechanical vent.  Nurse and physician at bedside state that they are having a hard time weaning the patient off the vent.  He has spiked a fever and every time they try to wean him off the vent he gets agitated and his BP spikes.  Objective:   VITALS:  Temp:  [99.8 F (37.7 C)-102 F (38.9 C)] 102 F (38.9 C) (01/22 0737) Pulse Rate:  [25-135] 25 (01/22 0600) Resp:  [15-20] 15 (01/22 0600) BP: (113-179)/(70-115) 116/71 (01/22 0600) SpO2:  [96 %-100 %] 97 % (01/22 0600) Arterial Line BP: (136-243)/(66-115) 168/75 (01/22 0500) FiO2 (%):  [40 %] 40 % (01/22 0600) Weight:  [153.1 kg] 153.1 kg (01/22 0500)  General: WDWN patient in NAD. Neuro: unable to assess this morning due to sedation Chest:  Even respirations on mechanical vent Skin:  SLS C/D/I, no rashes or lesions Extremities: warm/dry, no visible edema, erythema or echymosis.  No lymphadenopathy. Pulses: Popliteus 2+ MSK:  Unable to assess this morning due to sedation    LABS Recent Labs    09/20/18 0419 09/21/18 0345  HGB 13.4 12.9*  WBC 6.6 6.6  PLT 199 228   Recent Labs    09/20/18 0419 09/21/18 0345  NA 145 147*  K 3.9 3.8  CL 111 108  CO2 28 28  BUN 47* 50*  CREATININE 2.17* 2.15*  GLUCOSE 146* 158*   No results for input(s): LABPT, INR in the last 72 hours.   Assessment/Plan: 6 Days Post-Op Procedure(s) (LRB): Left fibular and tibial open treatment with internal fixation (Left)  NWB L LE No evidence of lymphedema or streaking up operative extremity. Reinforce splint prn CCM management appreciated.  Alfredo Martinez PA-C [facility] Office:  [phone]   Florentina Addison [facility] 09/21/2018, 7:53 AM

## 2018-09-21 NOTE — Progress Notes (Signed)
RT NOTE:  PT was transported to CT and back to unit without any complications.

## 2018-09-22 ENCOUNTER — Other Ambulatory Visit: Payer: Self-pay

## 2018-09-22 ENCOUNTER — Inpatient Hospital Stay (HOSPITAL_COMMUNITY): Payer: No Typology Code available for payment source

## 2018-09-22 DIAGNOSIS — I469 Cardiac arrest, cause unspecified: Secondary | ICD-10-CM

## 2018-09-22 LAB — BLOOD GAS, ARTERIAL
Acid-Base Excess: 3.3 mmol/L — ABNORMAL HIGH (ref 0.0–2.0)
Bicarbonate: 29.9 mmol/L — ABNORMAL HIGH (ref 20.0–28.0)
Drawn by: 213381
FIO2: 100
MECHVT: 550 mL
O2 Saturation: 91.4 %
PEEP: 10 cmH2O
Patient temperature: 101.4
RATE: 16 resp/min
pCO2 arterial: 75.3 mmHg (ref 32.0–48.0)
pH, Arterial: 7.234 — ABNORMAL LOW (ref 7.350–7.450)
pO2, Arterial: 82.8 mmHg — ABNORMAL LOW (ref 83.0–108.0)

## 2018-09-22 LAB — LACTIC ACID, PLASMA: Lactic Acid, Venous: 0.9 mmol/L (ref 0.5–1.9)

## 2018-09-22 LAB — CULTURE, BLOOD (ROUTINE X 2): Culture: NO GROWTH

## 2018-09-22 LAB — CBC
HCT: 44.1 % (ref 39.0–52.0)
HCT: 43.6 % (ref 39.0–52.0)
Hemoglobin: 12.7 g/dL — ABNORMAL LOW (ref 13.0–17.0)
Hemoglobin: 12.8 g/dL — ABNORMAL LOW (ref 13.0–17.0)
MCH: 27.7 pg (ref 26.0–34.0)
MCH: 28.3 pg (ref 26.0–34.0)
MCHC: 28.8 g/dL — AB (ref 30.0–36.0)
MCHC: 29.4 g/dL — ABNORMAL LOW (ref 30.0–36.0)
MCV: 96.3 fL (ref 80.0–100.0)
MCV: 96.5 fL (ref 80.0–100.0)
Platelets: 241 10*3/uL (ref 150–400)
Platelets: 254 10*3/uL (ref 150–400)
RBC: 4.58 MIL/uL (ref 4.22–5.81)
RBC: 4.52 MIL/uL (ref 4.22–5.81)
RDW: 14.8 % (ref 11.5–15.5)
RDW: 14.8 % (ref 11.5–15.5)
WBC: 6.2 10*3/uL (ref 4.0–10.5)
WBC: 9.6 10*3/uL (ref 4.0–10.5)
nRBC: 0 % (ref 0.0–0.2)
nRBC: 0 % (ref 0.0–0.2)

## 2018-09-22 LAB — COMPREHENSIVE METABOLIC PANEL
ALT: 68 U/L — AB (ref 0–44)
AST: 74 U/L — ABNORMAL HIGH (ref 15–41)
Albumin: 2.5 g/dL — ABNORMAL LOW (ref 3.5–5.0)
Alkaline Phosphatase: 113 U/L (ref 38–126)
Anion gap: 13 (ref 5–15)
BUN: 58 mg/dL — ABNORMAL HIGH (ref 6–20)
CALCIUM: 8.3 mg/dL — AB (ref 8.9–10.3)
CO2: 30 mmol/L (ref 22–32)
CREATININE: 2.9 mg/dL — AB (ref 0.61–1.24)
Chloride: 108 mmol/L (ref 98–111)
GFR calc Af Amer: 27 mL/min — ABNORMAL LOW (ref >60)
GFR calc non Af Amer: 23 mL/min — ABNORMAL LOW (ref >60)
Glucose, Bld: 124 mg/dL — ABNORMAL HIGH (ref 70–99)
Potassium: 3.4 mmol/L — ABNORMAL LOW (ref 3.5–5.1)
Sodium: 151 mmol/L — ABNORMAL HIGH (ref 135–145)
Total Bilirubin: 1.3 mg/dL — ABNORMAL HIGH (ref 0.3–1.2)
Total Protein: 6.6 g/dL (ref 6.5–8.1)

## 2018-09-22 LAB — GLUCOSE, CAPILLARY
GLUCOSE-CAPILLARY: 129 mg/dL — AB (ref 70–99)
GLUCOSE-CAPILLARY: 95 mg/dL (ref 70–99)
GLUCOSE-CAPILLARY: 84 mg/dL (ref 70–99)
Glucose-Capillary: 121 mg/dL — ABNORMAL HIGH (ref 70–99)
Glucose-Capillary: 125 mg/dL — ABNORMAL HIGH (ref 70–99)
Glucose-Capillary: 136 mg/dL — ABNORMAL HIGH (ref 70–99)

## 2018-09-22 LAB — BASIC METABOLIC PANEL
Anion gap: 9 (ref 5–15)
BUN: 56 mg/dL — ABNORMAL HIGH (ref 6–20)
CO2: 29 mmol/L (ref 22–32)
Calcium: 8.8 mg/dL — ABNORMAL LOW (ref 8.9–10.3)
Chloride: 110 mmol/L (ref 98–111)
Creatinine, Ser: 2.29 mg/dL — ABNORMAL HIGH (ref 0.61–1.24)
GFR calc non Af Amer: 31 mL/min — ABNORMAL LOW (ref >60)
GFR, EST AFRICAN AMERICAN: 36 mL/min — AB (ref >60)
Glucose, Bld: 132 mg/dL — ABNORMAL HIGH (ref 70–99)
Potassium: 3.5 mmol/L (ref 3.5–5.1)
SODIUM: 148 mmol/L — AB (ref 135–145)

## 2018-09-22 LAB — MAGNESIUM
Magnesium: 2.3 mg/dL (ref 1.7–2.4)
Magnesium: 2.3 mg/dL (ref 1.7–2.4)

## 2018-09-22 LAB — TROPONIN I
TROPONIN I: 0.03 ng/mL — AB (ref <0.03)
Troponin I: 0.21 ng/mL (ref <0.03)

## 2018-09-22 LAB — TRIGLYCERIDES: Triglycerides: 314 mg/dL — ABNORMAL HIGH (ref <150)

## 2018-09-22 MED ORDER — ROCURONIUM BROMIDE 50 MG/5ML IV SOLN
50.0000 mg | Freq: Once | INTRAVENOUS | Status: AC
  Administered 2018-09-22: 50 mg via INTRAVENOUS
  Filled 2018-09-22: qty 5

## 2018-09-22 MED ORDER — ROCURONIUM BROMIDE 50 MG/5ML IV SOLN
50.0000 mg | Freq: Once | INTRAVENOUS | Status: DC
  Filled 2018-09-22: qty 5

## 2018-09-22 MED ORDER — AMIODARONE IV BOLUS ONLY 150 MG/100ML: 150.0000 mg | Freq: Once | INTRAVENOUS | Status: DC

## 2018-09-22 MED ORDER — SODIUM CHLORIDE 0.45 % IV SOLN
INTRAVENOUS | Status: DC
  Administered 2018-09-22 – 2018-09-25 (×6): via INTRAVENOUS

## 2018-09-22 MED ORDER — MIDAZOLAM HCL 2 MG/2ML IJ SOLN
2.0000 mg | Freq: Once | INTRAMUSCULAR | Status: AC
  Administered 2018-09-22: 2 mg via INTRAVENOUS

## 2018-09-22 MED ORDER — SUCCINYLCHOLINE CHLORIDE 20 MG/ML IJ SOLN
200.0000 mg | Freq: Once | INTRAMUSCULAR | Status: AC
  Administered 2018-09-22: 200 mg via INTRAVENOUS
  Filled 2018-09-22: qty 10

## 2018-09-22 MED ORDER — METOPROLOL TARTRATE 5 MG/5ML IV SOLN
5.0000 mg | Freq: Once | INTRAVENOUS | Status: AC
  Administered 2018-09-22: 5 mg via INTRAVENOUS
  Filled 2018-09-22: qty 5

## 2018-09-22 MED ORDER — FENTANYL CITRATE (PF) 100 MCG/2ML IJ SOLN
50.0000 ug | Freq: Once | INTRAMUSCULAR | Status: AC
  Administered 2018-09-22: 50 ug via INTRAVENOUS
  Filled 2018-09-22: qty 2

## 2018-09-22 MED ORDER — ETOMIDATE 2 MG/ML IV SOLN
20.0000 mg | Freq: Once | INTRAVENOUS | Status: AC
  Administered 2018-09-22: 20 mg via INTRAVENOUS

## 2018-09-22 MED ORDER — SODIUM BICARBONATE 8.4 % IV SOLN: 100.0000 meq | Freq: Once | INTRAVENOUS | Status: DC

## 2018-09-22 MED ORDER — MIDAZOLAM HCL 2 MG/2ML IJ SOLN
2.0000 mg | Freq: Once | INTRAMUSCULAR | Status: DC
  Filled 2018-09-22: qty 2

## 2018-09-22 MED ORDER — EPINEPHRINE PF 1 MG/10ML IJ SOSY
1.0000 | PREFILLED_SYRINGE | Freq: Once | INTRAMUSCULAR | Status: AC
  Administered 2018-09-22: 1 mg via INTRAVENOUS

## 2018-09-22 MED ORDER — ETOMIDATE 2 MG/ML IV SOLN: 20.0000 mg | Freq: Once | INTRAVENOUS | Status: DC

## 2018-09-22 MED ORDER — POTASSIUM CHLORIDE 10 MEQ/50ML IV SOLN
10.0000 meq | INTRAVENOUS | Status: AC
  Administered 2018-09-22 (×3): 10 meq via INTRAVENOUS
  Filled 2018-09-22: qty 50

## 2018-09-22 MED ORDER — SUCCINYLCHOLINE CHLORIDE 20 MG/ML IJ SOLN
100.0000 mg | Freq: Once | INTRAMUSCULAR | Status: DC
  Filled 2018-09-22: qty 5

## 2018-09-22 MED FILL — Medication: Qty: 1 | Status: AC

## 2018-09-22 NOTE — Procedures (Signed)
Arterial Catheter Insertion Procedure Note Valaria GoodJames Weinel [id number] 08/25/1966  Procedure: Insertion of Arterial Catheter  Indications: Blood pressure monitoring and Frequent blood sampling  Procedure Details Consent: Unable to obtain consent because of emergent medical necessity. Time Out: Verified patient identification, verified procedure, site/side was marked, verified correct patient position, special equipment/implants available, medications/allergies/relevent history reviewed, required imaging and test results available.  Performed  Maximum sterile technique was used including antiseptics, cap, gloves, gown, hand hygiene, mask and sheet. Skin prep: Chlorhexidine; local anesthetic administered 20 gauge catheter was inserted into right femoral artery using the Seldinger technique.  Evaluation Blood flow good; BP tracing good. Complications: No apparent complications.   Brett CanalesSteve Chau Sawin ACNP [facility] PCCM Pager [phone] till 3 pm If no answer page [phone] 09/22/2018, 12:41 PM

## 2018-09-22 NOTE — Progress Notes (Addendum)
Pt extubated to per order against Respiratory Therapy's judgement.  Pt had increase in HR and secretions via ETT and nasally.  Per resident order was to extubate to bipap.  Pt placed on bipap post extubation without success due to low VT and RR effort.  PT taken off bipap within 5 minutes and bagged while another RT prepared equipment to re-intubate.  NP Minor was at bedside assisting.  Multiple attempt with glidescope, bougie and regular Mac 4 was attempted multiple times.  Pt subsequently went bradycardic and CPR was started. (See MD or RN note) Pt back on previous vent settings except 100% FIO2 and 10 peep.  ABG obtained, xray pending.  RT will continue to monitor.

## 2018-09-22 NOTE — Progress Notes (Signed)
PT Cancellation Note  Patient Details Name: Rodney Hoffman MRN: [id number] DOB: November 27, 1965   Cancelled Treatment:    Reason Eval/Treat Not Completed: Patient not medically ready. Noted code blue called for pt. Will follow-up for PT evaluation when medically appropriate.  Ina Homes, PT, DPT Acute Rehabilitation Services  Pager [phone] Office [phone]  Malachy Chamber 09/22/2018, 11:14 AM

## 2018-09-22 NOTE — Progress Notes (Signed)
Patient woke up and began to get very agitated after going down on Propofol. Patient started to gag on ET tube and RN noticed what appeared to be tube feed coming out of nose but nothing from mouth. Tube feed was stopped and OG hooked to suction. Lungs sound slightly rhonchi and faint bowel sounds. Elink MD notified.

## 2018-09-22 NOTE — Progress Notes (Addendum)
After sedation turned off pt quickly went into SVT w/ rate as high as 180. MD notified and received verbal order for 5mg  metoprolol and fentanyl push.  Pt also w/ out OG tube this shift.  MD requesting extubation straight to Bipap.  Relayed pt OG output to CCM, still requesting extubation to Bipap.  Pt extubated per MD order ~1045.  Upon extubation pt had copious secretions out nose and mouth.  Suctioned nose and mouth, cleaned, and applied Bipap.  Pt immediately w/ labored breathing, became tachypnic and tachycardic, hypertensive, intermittently following commands.  CCM MD and NP called to bedside, plan to reintubate.  RSI meds given, significant difficulty intubating, during which SpO2 dropped and pulse lost. Code called.

## 2018-09-22 NOTE — Progress Notes (Signed)
CRITICAL VALUE ALERT  Critical Value: Troponin   Date & Time Notied: 09/22/18 05:30  Provider Notified: Dr. Dillard Essexgan Elink  Orders Received/Actions taken: Continue to monitor

## 2018-09-22 NOTE — Progress Notes (Signed)
SLP Cancellation Note  Patient Details Name: Rodney Hoffman MRN: [id number] DOB: 01/03/1966   Cancelled treatment:       Reason Eval/Treat Not Completed: Medical issues which prohibited therapy. Pt reintubated. Will follow up for readiness.  Rondel Baton, [place], CCC-SLP Speech-Language Pathologist Acute Rehabilitation Services Pager: [phone] Office: [phone]    Arlana Lindau 09/22/2018, 1:00 PM

## 2018-09-22 NOTE — Progress Notes (Signed)
Plan to extubate pt to Bipap. All sedation turned off, RT called and updated.  Pt also has not had BM since PTA. Currently giving colace, bowel sounds active. Relayed to CCM MD. Will not address at this time.

## 2018-09-22 NOTE — Progress Notes (Signed)
Patient began to have abnormal EKG rhythm on monitor. 12- lead EKG was done. ELink MD notified.

## 2018-09-22 NOTE — Progress Notes (Signed)
eLink Physician-Brief Progress Note Patient Name: Rodney Hoffman DOB: 1966/05/03 MRN: [id number]   Date of Service  09/22/2018  HPI/Events of Note  EPISODE OF WIDE-COMPLEX TACHYCARDIA WITH HEMODYNAMIC STABILITY AND SPONTANEOUS RESOLUTION. flipped T'S IN LEADS 1 AND aVL  eICU Interventions  Check BMET Mg ++, and serial cardiac enzymes        Alexina Niccoli U Kahle Mcqueen 09/22/2018, 4:01 AM

## 2018-09-22 NOTE — Progress Notes (Signed)
Code Blue called- Family called by doctor-will be glad to come back when family arrives.    09/22/18 1100  Clinical Encounter Type  Visited With Patient not available  Visit Type Initial  Referral From Other (Comment) (code blue)  Consult/Referral To Chaplain  Spiritual Encounters  Spiritual Needs Other (Comment) (family called by doctor to come-on way)  Stress Factors  Patient Stress Factors Not reviewed

## 2018-09-22 NOTE — Progress Notes (Signed)
eLink Physician-Brief Progress Note Patient Name: Rodney Hoffman DOB: [birth year] MRN: [id number]   Date of Service  09/22/2018  HPI/Events of Note  Possible aspiration  eICU Interventions  Stat portable CXR and KUB        [name] 09/22/2018, 6:50 AM

## 2018-09-22 NOTE — Progress Notes (Signed)
Pts K:3.4, Na:151.  TF and free water have been held since this AM.  Rodney Hoffman w/ CCM MD to relay. Per CCM, continue to hold TF overnight, will put in orders to correct electrolytes.

## 2018-09-22 NOTE — Progress Notes (Signed)
Subjective: 7 Days Post-Op Procedure(s) (LRB): Left fibular and tibial open treatment with internal fixation (Left) Patient is intubated and sedated after attempted extubation and subsequent code and re-intubation.  CCM notes reviewed.  Objective: Vital signs in last 24 hours: Temp:  [100 F (37.8 C)-101.1 F (38.4 C)] 100.5 F (38.1 C) (01/23 1430) Pulse Rate:  [40-217] 98 (01/23 1300) Resp:  [0-23] 15 (01/23 1300) BP: (98-142)/(58-88) 113/67 (01/23 1300) SpO2:  [92 %-100 %] 92 % (01/23 1300) Arterial Line BP: (98)/(52) 98/52 (01/23 1300) FiO2 (%):  [40 %-100 %] 100 % (01/23 1208) Weight:  [153.2 kg] 153.2 kg (01/23 0500)  Intake/Output from previous day: 01/22 0701 - 01/23 0700 In: 3597.8 [I.V.:2657.9; NG/GT:840; IV Piggyback:99.9] Out: 6068 [Urine:6068] Intake/Output this shift: Total I/O In: 227.4 [I.V.:227.4] Out: 1300 [Urine:875; Emesis/NG output:425]  Recent Labs    09/20/18 0419 09/21/18 0345 09/22/18 0353  HGB 13.4 12.9* 12.7*   Recent Labs    09/21/18 0345 09/22/18 0353  WBC 6.6 6.2  RBC 4.53 4.58  HCT 43.9 44.1  PLT 228 241   Recent Labs    09/21/18 0345 09/22/18 0404  NA 147* 148*  K 3.8 3.5  CL 108 110  CO2 28 29  BUN 50* 56*  CREATININE 2.15* 2.29*  GLUCOSE 158* 132*  CALCIUM 8.9 8.8*   No results for input(s): LABPT, INR in the last 72 hours.  Obese male intubated and sedated in the ICU.  L LE splinted.  Brisk cap refill at the toes.  Splint is in good shape.    Assessment/Plan: 7 Days Post-Op Procedure(s) (LRB): Left fibular and tibial open treatment with internal fixation (Left) c/b post op respiratory failure.  Continue NWB on the L LE.  I'll continue to follow.     Toni Arthurs 09/22/2018, 3:01 PM

## 2018-09-22 NOTE — Progress Notes (Signed)
Received call from lab w/ critical troponin:0.21. Relayed to CCM MD

## 2018-09-22 NOTE — Progress Notes (Signed)
Family arrived after code blue and chaplain met with them to let them share concerns.  Had prayer with Merry Lofty and Pryor Curia for patient to feel peace in mind and heart and for the family also.  Prayers for medical staff and wisdom in their care for him and all patients.  Was not able to visit with patient but family very receptive of pastoral care.    09/22/18 1400  Clinical Encounter Type  Visited With Family  Visit Type Initial;Spiritual support;Critical Care  Referral From Nurse  Consult/Referral To Chaplain  Spiritual Encounters  Spiritual Needs Prayer;Emotional  Stress Factors  Patient Stress Factors Health changes  Family Stress Factors Health changes

## 2018-09-22 NOTE — Code Documentation (Signed)
  Patient Name: Rodney Hoffman   MRN: [id number]   Date of Birth/ Sex: [birth year] , male      Admission Date: 09/08/2018  Attending Provider: Lynnell Catalan, MD  Primary Diagnosis: <principal problem not specified>   Indication: Pt was in his usual state of health until this AM, when he was noted to be in respiratory distress and pulseless Vtach. Code blue was subsequently called. At the time of arrival on scene, ACLS protocol was underway.   Technical Description:  - CPR performance duration:  10 minutes  - Was defibrillation or cardioversion used? Yes   - Was external pacer placed? No  - Was patient intubated pre/post CPR? Yes   Medications Administered: Y = Yes; Blank = No Amiodarone  Y  Atropine    Calcium    Epinephrine  Y  Lidocaine    Magnesium    Norepinephrine    Phenylephrine    Sodium bicarbonate  Y  Vasopressin     Post CPR evaluation:  - Final Status - Was patient successfully resuscitated ? Yes - What is current rhythm? Sinus rhythm - What is current hemodynamic status? Stable, following commands  Miscellaneous Information:  - Labs sent, including: Lactic acid, CBC, CMP, Mag  - Primary team notified?  Yes  - Family Notified? No  - Additional notes/ transfer status: In ICU      Jasma Seevers A, DO  09/22/2018, 11:28 AM

## 2018-09-22 NOTE — Progress Notes (Signed)
NAME:  Rodney Hoffman, MRN:  [id number], DOB:  [birth year], LOS: 7 ADMISSION DATE:  09/19/2018, CONSULTATION DATE:  1/16 REFERRING MD:  Dr. Victorino Dike, CHIEF COMPLAINT:  Post op respiratory failure  Brief History   This is a 53 year old male with a history of HTN, OSA and DM who after getting a ORIF of his left fibular and tibial developed hypoxic and hypercarbic respiratory failure requiring intubation and transfer to [facility] and the ICU for airway management.  History of present illness   53 year old male with past medical history as below, which is significant for tobacco abuse and diabetes.  The patient is encephalopathic and therefore history is obtained largely from chart review.  He suffered a left ankle fracture about a year ago and after some lifestyle modification and better management of his diabetes he presented on 1/16 to the outpatient surgery center for left ankle ORIF under Dr. Victorino Dike.  He was extubated immediately postoperatively, however, he then developed hypoxemia with sats down into the low 80s and altered mental status progressing to obtundation.  He was transferred to [facility] and PCCM for airway management.  He was given 120 mg IV lasix and had poor output, he became profoundly hypotensive. Bedside u/s showed dilated RV and hyperdynamic LV. Cardiology called and patient was started on levophed drip. He initially had respiratory acidosis and was started on bicarbonate drip, follow up ABG showed respiratory alkalosis so bicarbonate drip was stopped.    Past Medical History   Past Medical History:  Diagnosis Date  . Diabetes mellitus without complication (HCC)   . Hypertension   . Sleep apnea    does not wear CPAP     Significant Hospital Events   1/16 ORIF L ankle. Failed extubation. Transfer to [facility]/PCCM  Consults:    Procedures:  1/16 L ankle ORIF (Dr. Victorino Dike) 1/16 ETT  Significant Diagnostic Tests:   Echocardiogram 09/16/18 Impressions:  - Normal LV size  with moderate LV hypertrophy. EF 50-55%, low   normal to mildly decreased. Basal inferior akinesis. Mildly   dilated RV with mild to moderately decreased systolic function.   Dilated IVC suggestive of elevated RV filling pressure.  CT head 09/21/18   IMPRESSION: No acute intracranial normality.  Pansinus disease and bilateral mastoid effusions. Micro Data:    Antimicrobials:     Interim history/subjective:  Overnight, patient developed a short run of wide complex tachycardia, noted on EKG. Patient became very agitated and started to gag on the ET tube, RN noticed tube feed coming out of the nose and tube feeding was stopped. KUB showed no acute abnormalities. CXR showed no significant changes from prior.    Patient is currently intubated and sedated, no family at bedside. Not following commands.   Objective   Blood pressure 135/88, pulse 92, temperature (!) 101 F (38.3 C), temperature source Oral, resp. rate 18, height 6\' 1"  (1.854 m), weight (!) 153.2 kg, SpO2 97 %. CVP:  [15 mmHg-20 mmHg] 15 mmHg  Vent Mode: PRVC FiO2 (%):  [40 %] 40 % Set Rate:  [15 bmp] 15 bmp Vt Set:  [550 mL] 550 mL PEEP:  [8 cmH20] 8 cmH20 Plateau Pressure:  [25 cmH20-28 cmH20] 25 cmH20   Intake/Output Summary (Last 24 hours) at 09/22/2018 0817 Last data filed at 09/22/2018 0600 Gross per 24 hour  Intake 3315.44 ml  Output 5933 ml  Net -2617.56 ml   Filed Weights   09/20/18 0404 09/21/18 0500 09/22/18 0500  Weight: (!) 159.1 kg Marland Kitchen)  153.1 kg (!) 153.2 kg    Examination: General: Intubated, left central line, obese male, sedated HENT: Normocephalic, atraumatic Lungs: Minimal bibasilar crackles, no increased work of breathing  Cardiovascular: RRR, no m/r/g Abdomen: soft, no guarding Extremities: Left leg in splint, trace edema Neuro: Sedated GU: Foley in place  Resolved Hospital Problem list     Assessment & Plan:   Acute hypoxic respiratory failure:  Currently intubated: Respiratory  acidosis: Patient has been difficult to wean of the sedative medications due to becoming very agitated and hypertensive.  Yesterday during rounds we did a SBT and he was able to breath spontaneously, however was taking in only 200-300 tidal volumes.  He did a repeat CT head which showed no acute findings.  He is likely a candidate for tracheotomy however we decided that he may just take and low tidal volume at his baseline and wanted to attempt to extubate to try and avoid a permanent tracheotomy.  -Follow-up blood cultures -Continue ceftriaxone for possible bacteremia -Pulmonary hygiene  -CXR showed improvement in his pulmonary edema.  -Daily SBT, daily sedation vacation -Continue fentanyl and propofol drip, attempting to decrease sedation -Continue oxycodone 10 q6 hr  -Continue versed 1-2 mg PRN -Increase seroquel to 100 mg BID -Continue klonopin 0.5 BID -Continue hydralazine 5 mg q4hr PRN -Monitor CVP -Extubate today with possibility of reintubation  Shock: Most likely multifactorial in the setting of attempted diuresis, grade 1 diastolic dysfunction, dilated right ventricle, elevated right ventricle pressures made worse by propofol drip. -Not on any pressors at the moment -Cortisol level > 134 -Blood Cultures > strep viridans growing in 1/4 bottles  AKI: Cr improved today to 2.17, down from 2.94 yesterday. Likely 2/2 pre-renal azotemia from over-diuresis.  -BMP in AM   Left ankle injury -S/p ORIF on 1/16 by Dr. Victorino DikeHewitt -Per ortho >> reinforce splint as needed, will need PT/OT as he becomes stable. No evidence of lymphedema or streaking up left leg.   DM: -SSI -Frequent CBGs  HTN: -Patient is on lisinopril at home, BP today was on the softer side and he had required a levoped drip.  -Hold Lisinopril -Can restart as necessary   Best practice:  Diet: Tube feeds Pain/Anxiety/Delirium protocol (if indicated): Propofol for RASS -1 to -2 VAP protocol (if indicated): Yes DVT  prophylaxis: Heparin GI prophylaxis: PPI Glucose control: SSI Mobility: Bed ridden Code Status: Full Family Communication: Spoke with patient's girlfriend Chip BoerVicki Disposition: Remain in ICU  Labs   CBC: Recent Labs  Lab 09/23/2018 1655 09/16/18 0334 09/18/18 0439 09/20/18 0419 09/21/18 0345 09/22/18 0353  WBC 12.7* 8.7 5.9 6.6 6.6 6.2  NEUTROABS 10.9*  --  4.3  --  4.4  --   HGB 16.4 14.6 13.0 13.4 12.9* 12.7*  HCT 59.1* 48.4 42.8 47.0 43.9 44.1  MCV 101.4* 95.3 95.5 97.7 96.9 96.3  PLT 289 203 201 199 228 241  3  Basic Metabolic Panel: Recent Labs  Lab 09/16/18 1400 09/16/18 1700 09/17/18 0436 09/17/18 1843 09/18/18 0439 09/19/18 0904 09/20/18 0419 09/21/18 0345 09/22/18 0404  NA  --   --  141  --  145 146* 145 147* 148*  K  --   --  3.5  --  3.6 3.8 3.9 3.8 3.5  CL  --   --  102  --  108 111 111 108 110  CO2  --   --  28  --  25 25 28 28 29   GLUCOSE  --   --  137*  --  104* 126* 146* 158* 132*  BUN  --   --  31*  --  43* 51* 47* 50* 56*  CREATININE  --   --  2.94*  --  2.19* 2.19* 2.17* 2.15* 2.29*  CALCIUM  --   --  7.6*  --  7.8* 8.2* 8.6* 8.9 8.8*  MG 0.8* 1.7 1.9 2.0 2.3  --   --  2.5* 2.3  PHOS 1.5* 4.3 5.6* 4.4 3.8  --   --   --   --    GFR: Estimated Creatinine Clearance: 57 mL/min (A) (by C-G formula based on SCr of 2.29 mg/dL (H)). Recent Labs  Lab 09/12/2018 1715 09/28/2018 2200  09/17/18 1058 09/18/18 0439 09/19/18 0406 09/20/18 0419 09/21/18 0345 09/22/18 0353  PROCALCITON  --   --   --  13.59 7.05 5.32  --   --   --   WBC  --   --    < >  --  5.9  --  6.6 6.6 6.2  LATICACIDVEN 1.3 2.0*  --   --   --   --   --   --   --    < > = values in this interval not displayed.    Liver Function Tests: Recent Labs  Lab 09/12/2018 1655  AST 19  ALT 14  ALKPHOS 78  BILITOT 0.7  PROT 7.1  ALBUMIN 4.0   No results for input(s): LIPASE, AMYLASE in the last 168 hours. No results for input(s): AMMONIA in the last 168 hours.  ABG    Component Value  Date/Time   PHART 7.446 09/18/2018 0330   PCO2ART 40.9 09/18/2018 0330   PO2ART 78.7 (L) 09/18/2018 0330   HCO3 27.7 09/18/2018 0330   TCO2 35 (H) 09/16/2018 1344   O2SAT 96.0 09/18/2018 0330     Coagulation Profile: No results for input(s): INR, PROTIME in the last 168 hours.  Cardiac Enzymes: Recent Labs  Lab 09/02/2018 1655 09/22/18 0404  TROPONINI <0.03 0.03*    HbA1C: No results found for: HGBA1C  CBG: Recent Labs  Lab 09/21/18 1516 09/21/18 2020 09/22/18 0020 09/22/18 0336 09/22/18 0802  GLUCAP 110* 139* 136* 121* 125*    Review of Systems:   Currently intubated and sedated  Past Medical History  He,  has a past medical history of Diabetes mellitus without complication (HCC), Hypertension, and Sleep apnea.   Surgical History    Past Surgical History:  Procedure Laterality Date  . APPENDECTOMY  1979  . ORIF ANKLE FRACTURE Left 09/30/2018   Procedure: Left fibular and tibial open treatment with internal fixation;  Surgeon: Toni Arthurs, MD;  Location: [facility];  Service: Orthopedics;  Laterality: Left;   Outpatient in bed     Social History   reports that he quit smoking about 2 months ago. His smoking use included cigarettes. He has never used smokeless tobacco. He reports previous alcohol use. He reports that he does not use drugs.   Family History   His family history is not on file.   Allergies No Known Allergies   Home Medications  Prior to Admission medications   Medication Sig Start Date End Date Taking? Authorizing Provider  glipiZIDE (GLUCOTROL) 5 MG tablet Take by mouth 2 (two) times daily before a meal.   Yes [provider]  lisinopril (PRINIVIL,ZESTRIL) 10 MG tablet Take 10 mg by mouth daily.   Yes [provider]  metFORMIN (GLUCOPHAGE) 500 MG tablet Take by mouth 2 (  two) times daily with a meal.   Yes [provider]  aspirin EC 81 MG tablet Take 1 tablet (81 mg total) by mouth 2 (two)  times daily. 09/21/2018   Jacinta Shoe, PA-C  docusate sodium (COLACE) 100 MG capsule Take 1 capsule (100 mg total) by mouth 2 (two) times daily. While taking narcotic pain medicine. 09/01/2018   Jacinta Shoe, PA-C  oxyCODONE (ROXICODONE) 5 MG immediate release tablet Take 1 tablet (5 mg total) by mouth every 4 (four) hours as needed for up to 5 days for severe pain. 09/20/2018 09/20/18  Jacinta Shoe, PA-C  senna (SENOKOT) 8.6 MG TABS tablet Take 2 tablets (17.2 mg total) by mouth 2 (two) times daily. 09/07/2018   Jacinta Shoe, PA-C     Critical care time:     Claudean Severance, M.D. PGY1 Pager [phone] 09/22/2018 8:17 AM

## 2018-09-22 NOTE — Progress Notes (Signed)
ETT advanced from 23cm at lip to 26cm at lip per NP based on CXR.   ETT resecured.  RT will monitor.

## 2018-09-22 NOTE — Progress Notes (Signed)
After attempted extubation patient was placed on BiPAP.  He had a significant work of breathing and had copious amounts of secretions coming out of both his mouth and his nose.  Patient developed respiratory distress and required reintubation.  Patient had a significantly difficult intubation due to diffuse airway damage and copious secretions blocking the view.  He subsequently went into pulseless V. tach and a CODE BLUE was called.  Please see note for details.  Patient was able to be reintubated after much difficulty and was hemodynamically stabilized. Spoke with patient's only contact Jerral Ralph who reported that Mr. Hashagen does not have any family members to contact.  We discussed the events of the day and that Ms. Ghere will likely need to have a tracheotomy due to the failed extubation attempt and she is agreeable.

## 2018-09-23 LAB — POCT I-STAT 7, (LYTES, BLD GAS, ICA,H+H)
Acid-Base Excess: 5 mmol/L — ABNORMAL HIGH (ref 0.0–2.0)
Bicarbonate: 32 mmol/L — ABNORMAL HIGH (ref 20.0–28.0)
Calcium, Ion: 1.13 mmol/L — ABNORMAL LOW (ref 1.15–1.40)
HCT: 40 % (ref 39.0–52.0)
HEMOGLOBIN: 13.6 g/dL (ref 13.0–17.0)
O2 Saturation: 91 %
PH ART: 7.36 (ref 7.350–7.450)
Patient temperature: 98.8
Potassium: 3.7 mmol/L (ref 3.5–5.1)
Sodium: 151 mmol/L — ABNORMAL HIGH (ref 135–145)
TCO2: 34 mmol/L — AB (ref 22–32)
pCO2 arterial: 56.7 mmHg — ABNORMAL HIGH (ref 32.0–48.0)
pO2, Arterial: 66 mmHg — ABNORMAL LOW (ref 83.0–108.0)

## 2018-09-23 LAB — BASIC METABOLIC PANEL
Anion gap: 12 (ref 5–15)
BUN: 62 mg/dL — ABNORMAL HIGH (ref 6–20)
CO2: 27 mmol/L (ref 22–32)
Calcium: 8.5 mg/dL — ABNORMAL LOW (ref 8.9–10.3)
Chloride: 112 mmol/L — ABNORMAL HIGH (ref 98–111)
Creatinine, Ser: 2.79 mg/dL — ABNORMAL HIGH (ref 0.61–1.24)
GFR calc Af Amer: 28 mL/min — ABNORMAL LOW (ref >60)
GFR calc non Af Amer: 25 mL/min — ABNORMAL LOW (ref >60)
Glucose, Bld: 133 mg/dL — ABNORMAL HIGH (ref 70–99)
Potassium: 3.7 mmol/L (ref 3.5–5.1)
Sodium: 151 mmol/L — ABNORMAL HIGH (ref 135–145)

## 2018-09-23 LAB — CBC WITH DIFFERENTIAL/PLATELET
ABS IMMATURE GRANULOCYTES: 0.05 10*3/uL (ref 0.00–0.07)
BASOS PCT: 1 %
Basophils Absolute: 0.1 10*3/uL (ref 0.0–0.1)
Eosinophils Absolute: 0.3 10*3/uL (ref 0.0–0.5)
Eosinophils Relative: 4 %
HCT: 42.9 % (ref 39.0–52.0)
Hemoglobin: 12.3 g/dL — ABNORMAL LOW (ref 13.0–17.0)
Immature Granulocytes: 1 %
Lymphocytes Relative: 16 %
Lymphs Abs: 1.1 10*3/uL (ref 0.7–4.0)
MCH: 28.1 pg (ref 26.0–34.0)
MCHC: 28.7 g/dL — ABNORMAL LOW (ref 30.0–36.0)
MCV: 97.9 fL (ref 80.0–100.0)
Monocytes Absolute: 0.6 10*3/uL (ref 0.1–1.0)
Monocytes Relative: 8 %
Neutro Abs: 5 10*3/uL (ref 1.7–7.7)
Neutrophils Relative %: 70 %
PLATELETS: 250 10*3/uL (ref 150–400)
RBC: 4.38 MIL/uL (ref 4.22–5.81)
RDW: 14.8 % (ref 11.5–15.5)
WBC: 7.2 10*3/uL (ref 4.0–10.5)
nRBC: 0 % (ref 0.0–0.2)

## 2018-09-23 LAB — GLUCOSE, CAPILLARY
GLUCOSE-CAPILLARY: 116 mg/dL — AB (ref 70–99)
Glucose-Capillary: 100 mg/dL — ABNORMAL HIGH (ref 70–99)
Glucose-Capillary: 107 mg/dL — ABNORMAL HIGH (ref 70–99)
Glucose-Capillary: 111 mg/dL — ABNORMAL HIGH (ref 70–99)
Glucose-Capillary: 119 mg/dL — ABNORMAL HIGH (ref 70–99)
Glucose-Capillary: 136 mg/dL — ABNORMAL HIGH (ref 70–99)

## 2018-09-23 LAB — TRIGLYCERIDES: Triglycerides: 459 mg/dL — ABNORMAL HIGH (ref <150)

## 2018-09-23 LAB — PHOSPHORUS
Phosphorus: 4.4 mg/dL (ref 2.5–4.6)
Phosphorus: 4.6 mg/dL (ref 2.5–4.6)

## 2018-09-23 LAB — MAGNESIUM
Magnesium: 2.2 mg/dL (ref 1.7–2.4)
Magnesium: 2.3 mg/dL (ref 1.7–2.4)
Magnesium: 2.1 mg/dL (ref 1.7–2.4)

## 2018-09-23 MED ORDER — PRO-STAT SUGAR FREE PO LIQD
30.0000 mL | Freq: Two times a day (BID) | ORAL | Status: DC
  Administered 2018-09-23: 30 mL
  Filled 2018-09-23: qty 30

## 2018-09-23 MED ORDER — VITAL HIGH PROTEIN PO LIQD
1000.0000 mL | ORAL | Status: DC
  Administered 2018-09-23 (×2): 1000 mL

## 2018-09-23 MED ORDER — POLYETHYLENE GLYCOL 3350 17 G PO PACK
17.0000 g | PACK | Freq: Two times a day (BID) | ORAL | Status: DC
  Administered 2018-09-23 (×2): 17 g
  Filled 2018-09-23 (×2): qty 1

## 2018-09-23 MED ORDER — MIDAZOLAM 50MG/50ML (1MG/ML) PREMIX INFUSION
0.0000 mg/h | INTRAVENOUS | Status: DC
  Administered 2018-09-23: 1 mg/h via INTRAVENOUS
  Administered 2018-09-23 – 2018-09-24 (×2): 4 mg/h via INTRAVENOUS
  Administered 2018-09-24: 3 mg/h via INTRAVENOUS
  Administered 2018-09-25 (×2): 4 mg/h via INTRAVENOUS
  Administered 2018-09-26: 3 mg/h via INTRAVENOUS
  Administered 2018-09-26: 4 mg/h via INTRAVENOUS
  Administered 2018-09-27 (×2): 3 mg/h via INTRAVENOUS
  Administered 2018-09-27 – 2018-10-03 (×13): 4 mg/h via INTRAVENOUS
  Filled 2018-09-23 (×23): qty 50

## 2018-09-23 MED ORDER — SODIUM CHLORIDE 0.9 % IV SOLN
0.0000 mg/h | INTRAVENOUS | Status: DC
  Administered 2018-09-23: 4 mg/h via INTRAVENOUS
  Administered 2018-09-23: 1 mg/h via INTRAVENOUS
  Administered 2018-09-24: 3 mg/h via INTRAVENOUS
  Administered 2018-09-25 – 2018-09-27 (×5): 4 mg/h via INTRAVENOUS
  Administered 2018-09-27: 6 mg/h via INTRAVENOUS
  Administered 2018-09-27: 8 mg/h via INTRAVENOUS
  Administered 2018-09-28: 7 mg/h via INTRAVENOUS
  Administered 2018-09-28: 8 mg/h via INTRAVENOUS
  Administered 2018-09-28 – 2018-09-29 (×3): 10 mg/h via INTRAVENOUS
  Filled 2018-09-23 (×16): qty 5

## 2018-09-23 MED ORDER — POTASSIUM CHLORIDE 20 MEQ/15ML (10%) PO SOLN
40.0000 meq | Freq: Once | ORAL | Status: AC
  Administered 2018-09-23: 40 meq
  Filled 2018-09-23: qty 30

## 2018-09-23 MED ORDER — PROPOFOL 10 MG/ML IV BOLUS
200.0000 mg | Freq: Once | INTRAVENOUS | Status: DC
  Filled 2018-09-23: qty 20

## 2018-09-23 MED ORDER — MIDAZOLAM HCL 2 MG/2ML IJ SOLN: 5.0000 mg | Freq: Once | INTRAMUSCULAR | Status: DC

## 2018-09-23 MED ORDER — ETOMIDATE 2 MG/ML IV SOLN: 20.0000 mg | Freq: Once | INTRAVENOUS | Status: DC

## 2018-09-23 MED ORDER — VITAL HIGH PROTEIN PO LIQD: 1000.0000 mL | ORAL | Status: DC

## 2018-09-23 MED ORDER — PRO-STAT SUGAR FREE PO LIQD
30.0000 mL | Freq: Three times a day (TID) | ORAL | Status: DC
  Administered 2018-09-23 (×2): 30 mL
  Filled 2018-09-23 (×2): qty 30

## 2018-09-23 MED ORDER — FENTANYL CITRATE (PF) 100 MCG/2ML IJ SOLN: 200.0000 ug | Freq: Once | INTRAMUSCULAR | Status: DC

## 2018-09-23 MED ORDER — SENNOSIDES-DOCUSATE SODIUM 8.6-50 MG PO TABS
1.0000 | ORAL_TABLET | Freq: Two times a day (BID) | ORAL | Status: DC
  Administered 2018-09-23 (×2): 1
  Filled 2018-09-23 (×2): qty 1

## 2018-09-23 MED ORDER — MIDAZOLAM BOLUS VIA INFUSION
1.0000 mg | INTRAVENOUS | Status: DC | PRN
  Administered 2018-09-23 – 2018-10-03 (×18): 1 mg via INTRAVENOUS
  Filled 2018-09-23: qty 1

## 2018-09-23 MED ORDER — HYDROMORPHONE BOLUS VIA INFUSION
1.0000 mg | INTRAVENOUS | Status: DC | PRN
  Administered 2018-09-23 – 2018-10-03 (×23): 1 mg via INTRAVENOUS
  Filled 2018-09-23: qty 1

## 2018-09-23 MED ORDER — PROPOFOL 1000 MG/100ML IV EMUL
0.0000 ug/kg/min | INTRAVENOUS | Status: DC
  Administered 2018-09-23: 5 ug/kg/min via INTRAVENOUS
  Administered 2018-09-23: 25 ug/kg/min via INTRAVENOUS
  Administered 2018-09-24: 20 ug/kg/min via INTRAVENOUS
  Administered 2018-09-24: 30 ug/kg/min via INTRAVENOUS
  Administered 2018-09-24: 10 ug/kg/min via INTRAVENOUS
  Administered 2018-09-24: 20 ug/kg/min via INTRAVENOUS
  Administered 2018-09-24: 25 ug/kg/min via INTRAVENOUS
  Administered 2018-09-25: 35 ug/kg/min via INTRAVENOUS
  Administered 2018-09-25: 22.163 ug/kg/min via INTRAVENOUS
  Administered 2018-09-25: 30 ug/kg/min via INTRAVENOUS
  Administered 2018-09-25: 50 ug/kg/min via INTRAVENOUS
  Administered 2018-09-25: 40 ug/kg/min via INTRAVENOUS
  Administered 2018-09-25 (×2): 50 ug/kg/min via INTRAVENOUS
  Administered 2018-09-25: 40 ug/kg/min via INTRAVENOUS
  Administered 2018-09-26: 35 ug/kg/min via INTRAVENOUS
  Administered 2018-09-26: 30 ug/kg/min via INTRAVENOUS
  Administered 2018-09-26: 50 ug/kg/min via INTRAVENOUS
  Administered 2018-09-26: 60 ug/kg/min via INTRAVENOUS
  Administered 2018-09-26 (×2): 30 ug/kg/min via INTRAVENOUS
  Administered 2018-09-26 (×2): 50 ug/kg/min via INTRAVENOUS
  Administered 2018-09-27: 55 ug/kg/min via INTRAVENOUS
  Administered 2018-09-27: 45 ug/kg/min via INTRAVENOUS
  Administered 2018-09-27 (×2): 35 ug/kg/min via INTRAVENOUS
  Administered 2018-09-27 (×2): 45 ug/kg/min via INTRAVENOUS
  Administered 2018-09-27 (×4): 55 ug/kg/min via INTRAVENOUS
  Administered 2018-09-27: 65 ug/kg/min via INTRAVENOUS
  Administered 2018-09-28: 45 ug/kg/min via INTRAVENOUS
  Administered 2018-09-28: 40 ug/kg/min via INTRAVENOUS
  Administered 2018-09-28: 15 ug/kg/min via INTRAVENOUS
  Administered 2018-09-28: 30 ug/kg/min via INTRAVENOUS
  Administered 2018-09-28: 15 ug/kg/min via INTRAVENOUS
  Administered 2018-09-28: 20 ug/kg/min via INTRAVENOUS
  Administered 2018-09-28: 40 ug/kg/min via INTRAVENOUS
  Administered 2018-09-29 – 2018-09-30 (×3): 10 ug/kg/min via INTRAVENOUS
  Filled 2018-09-23 (×8): qty 100
  Filled 2018-09-23: qty 200
  Filled 2018-09-23 (×8): qty 100
  Filled 2018-09-23 (×3): qty 200
  Filled 2018-09-23 (×19): qty 100
  Filled 2018-09-23: qty 200
  Filled 2018-09-23: qty 100

## 2018-09-23 MED ORDER — VECURONIUM BROMIDE 10 MG IV SOLR: 10.0000 mg | Freq: Once | INTRAVENOUS | Status: DC

## 2018-09-23 MED ORDER — FREE WATER
200.0000 mL | Freq: Four times a day (QID) | Status: DC
  Administered 2018-09-23 – 2018-09-24 (×2): 200 mL

## 2018-09-23 NOTE — Progress Notes (Signed)
PT Cancellation Note  Patient Details Name: Rodney Hoffman MRN: [id number] DOB: 03-Jul-1966   Cancelled Treatment:    Reason Eval/Treat Not Completed: Patient not medically ready discussed with RN, patient heavily sedated and cont vent with high FiO2 and PEEP demands. Will cont to follow for appropriateness.     Etta Grandchild, PT, DPT Acute Rehabilitation Services Pager: [phone] Office: [phone]     Etta Grandchild 09/23/2018, 8:40 AM

## 2018-09-23 NOTE — Progress Notes (Addendum)
NAME:  Rodney Hoffman, MRN:  [id number], DOB:  08/16/1966, LOS: 8 ADMISSION DATE:  10-24-18, CONSULTATION DATE:  1/16 REFERRING MD:  Dr. Victorino DikeHewitt, CHIEF COMPLAINT:  Post op respiratory failure  Brief History   This is a 53 year old male with a history of HTN, OSA and DM who after getting a ORIF of his left fibular and tibial developed hypoxic and hypercarbic respiratory failure requiring intubation and transfer to [facility] and the ICU for airway management.  History of present illness   53 year old male with past medical history as below, which is significant for tobacco abuse and diabetes.  The patient is encephalopathic and therefore history is obtained largely from chart review.  He suffered a left ankle fracture about a year ago and after some lifestyle modification and better management of his diabetes he presented on 1/16 to the outpatient surgery center for left ankle ORIF under Dr. Victorino DikeHewitt.  He was extubated immediately postoperatively, however, he then developed hypoxemia with sats down into the low 80s and altered mental status progressing to obtundation.  He was transferred to [facility] and PCCM for airway management.  He was given 120 mg IV lasix and had poor output, he became profoundly hypotensive. Bedside u/s showed dilated RV and hyperdynamic LV. Cardiology called and patient was started on levophed drip. He initially had respiratory acidosis and was started on bicarbonate drip, follow up ABG showed respiratory alkalosis so bicarbonate drip was stopped.    Past Medical History   Past Medical History:  Diagnosis Date  . Diabetes mellitus without complication (HCC)   . Hypertension   . Sleep apnea    does not wear CPAP     Significant Hospital Events   1/16 ORIF L ankle. Failed extubation. Transfer to [facility]M 1/23 > Failed extubation, CODE BLUE called for respiratory distress and pulseless V tach and reintubated  Consults:    Procedures:  1/16 L ankle ORIF (Dr.  Victorino DikeHewitt) 1/16 ETT  Significant Diagnostic Tests:   Echocardiogram 09/16/18 Impressions:  - Normal LV size with moderate LV hypertrophy. EF 50-55%, low   normal to mildly decreased. Basal inferior akinesis. Mildly   dilated RV with mild to moderately decreased systolic function.   Dilated IVC suggestive of elevated RV filling pressure.  CT head 09/21/18   IMPRESSION: No acute intracranial normality.  Pansinus disease and bilateral mastoid effusions. Micro Data:    Antimicrobials:     Interim history/subjective:  Extubation was attempted yesterday however was unsuccessful due to development of severe respiratory distress and had a CODE BLUE and required reintubation.  No new events overnight. Today patient is currently intubated and sedated and not following commands.  He is on propofol, fentanyl, and Precedex drip with multiple as needed medications for sedation.   Objective   Blood pressure (!) 132/101, pulse 69, temperature 99.1 F (37.3 C), temperature source Oral, resp. rate 15, height 6\' 1"  (1.854 m), weight (!) 150.4 kg, SpO2 94 %. CVP:  [12 mmHg-13 mmHg] 13 mmHg  Vent Mode: PRVC FiO2 (%):  [40 %-100 %] 90 % Set Rate:  [15 bmp] 15 bmp Vt Set:  [550 mL] 550 mL PEEP:  [8 cmH20-10 cmH20] 10 cmH20 Plateau Pressure:  [18 cmH20-25 cmH20] 19 cmH20   Intake/Output Summary (Last 24 hours) at 09/23/2018 0715 Last data filed at 09/23/2018 0634 Gross per 24 hour  Intake 3444.65 ml  Output 4325 ml  Net -880.35 ml   Filed Weights   09/21/18 0500 09/22/18 0500 09/23/18 0328  Weight: (!) 153.1 kg (!) 153.2 kg (!) 150.4 kg    Examination: General: Intubated, left IJ central line in place, right femoral line in place, obese male, sedated, diaphoretic HENT: Normocephalic, atraumatic Lungs: Minimal bibasilar crackles, on ventilator Cardiovascular: RRR, no m/r/g Abdomen: soft, no guarding Extremities: Left leg in splint, trace edema Neuro: Sedated GU: Foley in  place  Resolved Hospital Problem list     Assessment & Plan:   Acute hypoxic respiratory failure:  Currently intubated: Respiratory acidosis: Attempted extubation yesterday however was unsuccessful.  Patient still requiring high doses of sedative medications with multiple as needed medications.  He still become significantly agitated when the propofol is decreased.  We discussed with the patient's girlfriend who has consented for tracheostomy.  It is unclear if the agitation is just related to the tube or if another issue is at play.  He does follow commands when the sedation has been weaned so a seizure is less likely.  CT head has been unremarkable.  We will plan for tracheostomy and attempt to decrease the sedation.  Patient has been having good output, we will continue to monitor CVP and diuresis as needed. -Initial blood cultures showed that 1 out of 4 bottles grew strep viridans -Continue ceftriaxone for possible bacteremia -Repeat blood cultures have no growth to date, will continue to follow -Pulmonary hygiene  -Daily SBT, we won't do daily sedation vacation due to his significant agitation that develops -Discontinue fentanyl, Precedex and propofol drip -Start Versed drip -Start Dilaudid drip -Discontinue oxycodone 10 q6 hr  -Continue versed 1-2 mg PRN -Continue Seroquel to 100 mg BID -Continue klonopin 0.5 BID -Continue hydralazine 5 mg q4hr PRN -Monitor CVP -Plan for tracheostomy -Check ABG  Shock: Most likely multifactorial in the setting of attempted diuresis, grade 1 diastolic dysfunction, dilated right ventricle, elevated right ventricle pressures made worse by propofol drip. -Not on any pressors at the moment -Cortisol level > 134 -Blood Cultures > strep viridans growing in 1/4 bottles -Repeat blood cultures > no growth to date  AKI: Creatinine 2.9 today, likely related to his CODE BLUE yesterday. -BMP in AM   Hypernatremia: -Na up to 151 today -Increase free  water to 200 q6 hours -Repeat BMP in AM  Left ankle injury -S/p ORIF on 1/16 by Dr. Victorino Dike -Per ortho >> reinforce splint as needed, will need PT/OT as he becomes stable. No evidence of lymphedema or streaking up left leg.   DM: -SSI -Frequent CBGs  HTN: -Patient is on lisinopril at home, BP today was on the softer side and he had required a levoped drip.  -Hold Lisinopril  -Can restart as necessary   Best practice:  Diet: ube feeds Pain/Anxiety/Delirium protocol (if indicated): Versed and dilaudid for RASS -1 to -2 VAP protocol (if indicated): Yes DVT prophylaxis: Heparin GI prophylaxis: PPI Glucose control: SSI Mobility: Bed ridden Code Status: Full Family Communication:  Disposition: Remain in ICU  Labs   CBC: Recent Labs  Lab 09/18/18 0439 09/20/18 0419 09/21/18 0345 09/22/18 0353 09/22/18 1504  WBC 5.9 6.6 6.6 6.2 9.6  NEUTROABS 4.3  --  4.4  --   --   HGB 13.0 13.4 12.9* 12.7* 12.8*  HCT 42.8 47.0 43.9 44.1 43.6  MCV 95.5 97.7 96.9 96.3 96.5  PLT 201 199 228 241 254  3  Basic Metabolic Panel: Recent Labs  Lab 09/16/18 1400 09/16/18 1700  09/17/18 0436 09/17/18 1843 09/18/18 0439 09/19/18 4585 09/20/18 0419 09/21/18 0345 09/22/18 0404 09/22/18 1504  NA  --   --    < > 141  --  145 146* 145 147* 148* 151*  K  --   --    < > 3.5  --  3.6 3.8 3.9 3.8 3.5 3.4*  CL  --   --    < > 102  --  108 111 111 108 110 108  CO2  --   --    < > 28  --  25 25 28 28 29 30   GLUCOSE  --   --    < > 137*  --  104* 126* 146* 158* 132* 124*  BUN  --   --    < > 31*  --  43* 51* 47* 50* 56* 58*  CREATININE  --   --    < > 2.94*  --  2.19* 2.19* 2.17* 2.15* 2.29* 2.90*  CALCIUM  --   --    < > 7.6*  --  7.8* 8.2* 8.6* 8.9 8.8* 8.3*  MG 0.8* 1.7  --  1.9 2.0 2.3  --   --  2.5* 2.3 2.3  PHOS 1.5* 4.3  --  5.6* 4.4 3.8  --   --   --   --   --    < > = values in this interval not displayed.   GFR: Estimated Creatinine Clearance: 44.5 mL/min (A) (by C-G formula based on  SCr of 2.9 mg/dL (H)). Recent Labs  Lab 09/17/18 1058  09/18/18 0439 09/19/18 0406 09/20/18 0419 09/21/18 0345 09/22/18 0353 09/22/18 1504  PROCALCITON 13.59  --  7.05 5.32  --   --   --   --   WBC  --    < > 5.9  --  6.6 6.6 6.2 9.6  LATICACIDVEN  --   --   --   --   --   --   --  0.9   < > = values in this interval not displayed.    Liver Function Tests: Recent Labs  Lab 09/22/18 1504  AST 74*  ALT 68*  ALKPHOS 113  BILITOT 1.3*  PROT 6.6  ALBUMIN 2.5*   No results for input(s): LIPASE, AMYLASE in the last 168 hours. No results for input(s): AMMONIA in the last 168 hours.  ABG    Component Value Date/Time   PHART 7.234 (L) 09/22/2018 1203   PCO2ART 75.3 (HH) 09/22/2018 1203   PO2ART 82.8 (L) 09/22/2018 1203   HCO3 29.9 (H) 09/22/2018 1203   TCO2 35 (H) 09/16/2018 1344   O2SAT 91.4 09/22/2018 1203     Coagulation Profile: No results for input(s): INR, PROTIME in the last 168 hours.  Cardiac Enzymes: Recent Labs  Lab 09/22/18 0404 09/22/18 1648  TROPONINI 0.03* 0.21*    HbA1C: No results found for: HGBA1C  CBG: Recent Labs  Lab 09/22/18 1306 09/22/18 1557 09/22/18 1947 09/22/18 2335 09/23/18 0337  GLUCAP 129* 95 84 111* 119*    Review of Systems:   Currently intubated and sedated  Past Medical History  He,  has a past medical history of Diabetes mellitus without complication (HCC), Hypertension, and Sleep apnea.   Surgical History    Past Surgical History:  Procedure Laterality Date  . APPENDECTOMY  1979  . ORIF ANKLE FRACTURE Left 2018-10-01   Procedure: Left fibular and tibial open treatment with internal fixation;  Surgeon: Toni Arthurs, MD;  Location: [facility];  Service: Orthopedics;  Laterality: Left;   Outpatient  in bed     Social History   reports that he quit smoking about 2 months ago. His smoking use included cigarettes. He has never used smokeless tobacco. He reports previous alcohol use. He reports  that he does not use drugs.   Family History   His family history is not on file.   Allergies No Known Allergies   Home Medications  Prior to Admission medications   Medication Sig Start Date End Date Taking? Authorizing Provider  glipiZIDE (GLUCOTROL) 5 MG tablet Take by mouth 2 (two) times daily before a meal.   Yes [provider]  lisinopril (PRINIVIL,ZESTRIL) 10 MG tablet Take 10 mg by mouth daily.   Yes [provider]  metFORMIN (GLUCOPHAGE) 500 MG tablet Take by mouth 2 (two) times daily with a meal.   Yes [provider]  aspirin EC 81 MG tablet Take 1 tablet (81 mg total) by mouth 2 (two) times daily. 2018-10-26   Jacinta Shoellis, Justin Pike, PA-C  docusate sodium (COLACE) 100 MG capsule Take 1 capsule (100 mg total) by mouth 2 (two) times daily. While taking narcotic pain medicine. 2018-10-26   Jacinta Shoellis, Justin Pike, PA-C  oxyCODONE (ROXICODONE) 5 MG immediate release tablet Take 1 tablet (5 mg total) by mouth every 4 (four) hours as needed for up to 5 days for severe pain. 2018-10-26 09/20/18  Jacinta Shoellis, Justin Pike, PA-C  senna (SENOKOT) 8.6 MG TABS tablet Take 2 tablets (17.2 mg total) by mouth 2 (two) times daily. 2018-10-26   Jacinta Shoellis, Justin Pike, PA-C     Critical care time:     Claudean SeveranceMarissa M Amarii Bordas, M.D. PGY1 Pager [phone] 09/23/2018 7:15 AM

## 2018-09-23 NOTE — Progress Notes (Signed)
Nutrition Follow-up  DOCUMENTATION CODES:   Morbid obesity  INTERVENTION:   Tube Feeding:  Begin Vital High Protein at 20 ml/hr with goal rate of 65 ml/hr Begin at 20 ml/hr; if tolerating, titrate by 10 mL q 8 hours until goal rate of 65 ml/hr Pro-Stat 30 mL TID Goal rate provides 1860 kcals, 182 g of protein and 1310 mL of free water  Total free water with scheduled free water flushes: 2110 mL  Reglan ordered prn; may benefit from scheduling reglan, at least until constipation resolved  NUTRITION DIAGNOSIS:   Inadequate oral intake related to inability to eat as evidenced by NPO status.  Being addressed via TF   GOAL:   Provide needs based on ASPEN/SCCM guidelines  Progressing  MONITOR:   TF tolerance, Vent status, Labs, Weight trends, Skin  REASON FOR ASSESSMENT:   Ventilator    ASSESSMENT:   53 y.o. male admitted with PMH of smoking and DM. 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 He has stopped smoking, and his A1C is below 7. He presented to [facility] for operative treatment of painful L ankle injury.  1/16 ORIF of L ankle, failed extubation post-op, re-intubated 1/23 Extubated, CODE BLUE with respiratory distress and pulseless V tach; Reintubated  Pt remains on vent support; noted plan for trach placement  Propofol discontinued today; sedation changed to dilaudid and versed drips  TF held with extubation yesterday and not restarted. Consult to resume today NG tube in place; abdominal xray from 1/23 without evidenced of ileus or obstruction. No output this shift but noted approx 1000 mL yesterday post re-intubation No BM since admission; on bowel regimen. Abdomen distended/obese; some areas firm on exam. Noted    Current wt 150.4 kg; noted weight of 144.8 kg on admission. Net negative 2 L per I/O flow sheet  Free water flushes 200 mL q  6 hours for hypernatremia  Labs: sodium 151 (H), Creatinine 2.79, BUN 62 Meds: lasix, MVI liquid, 1/2 NS at 75 ml/hr, reglan prn, ss novolog, senokot, miralax  Diet Order:   Diet Order            Diet NPO time specified  Diet effective now        Diet - low sodium heart healthy              EDUCATION NEEDS:   Not appropriate for education at this time  Skin:  Skin Assessment: Skin Integrity Issues: Skin Integrity Issues:: Incisions Incisions: L ankle (09/21/2018)  Last BM:  PTA/unknown  Height:   Ht Readings from Last 1 Encounters:  09/04/2018 6\' 1"  (1.854 m)    Weight:   Wt Readings from Last 1 Encounters:  09/23/18 (!) 150.4 kg    Ideal Body Weight:  83.64 kg  BMI:  Body mass index is 43.75 kg/m.  Estimated Nutritional Needs:   Kcal:  8921-1941 kcal  Protein:  167-209 grams   Fluid:  >/= 1.5 L/day   Romelle Starcher MS, RD, LDN, CNSC [phone] Pager  [phone] Weekend/On-Call Pager

## 2018-09-23 NOTE — Progress Notes (Signed)
Watsed 150 ml of fentanyl with Celene Kras, RN  Darrold Span, RN

## 2018-09-23 NOTE — Progress Notes (Signed)
Subjective: 8 Days Post-Op Procedure(s) (LRB): Left fibular and tibial open treatment with internal fixation (Left)  Patient resting in bed sedated and on mechanical vent.  Attempted extubation yesterday note successful per CCM notes.  Spoke to nurse this morning and the patient's girlfriend has consented for tracheostomy.  Objective:   VITALS:  Temp:  [97.3 F (36.3 C)-101.1 F (38.4 C)] 99.1 F (37.3 C) (01/24 0413) Pulse Rate:  [35-217] 69 (01/24 0540) Resp:  [15-36] 15 (01/24 0540) BP: (81-199)/(53-113) 132/101 (01/24 0500) SpO2:  [67 %-100 %] 94 % (01/24 0540) Arterial Line BP: (92-227)/(52-127) 154/76 (01/24 0540) FiO2 (%):  [40 %-100 %] 90 % (01/24 0413) Weight:  [150.4 kg] 150.4 kg (01/24 0328)  General: WDWN patient HEENT: unable to assess Chest:  Even chest rise and fall on mechanical vent. Skin:  SLS C/D/I, no rashes or lesions Extremities: warm/dry, no visible edema, erythema or echymosis.  No lymphadenopathy. Pulses: Popliteus 2+ MSK:  Unable to assess this morning    LABS Recent Labs    09/21/18 0345 09/22/18 0353 09/22/18 1504 09/23/18 0550  HGB 12.9* 12.7* 12.8* 12.3*  WBC 6.6 6.2 9.6 7.2  PLT 228 241 254 250   Recent Labs    09/22/18 0404 09/22/18 1504  NA 148* 151*  K 3.5 3.4*  CL 110 108  CO2 29 30  BUN 56* 58*  CREATININE 2.29* 2.90*  GLUCOSE 132* 124*   No results for input(s): LABPT, INR in the last 72 hours.   Assessment/Plan: 8 Days Post-Op Procedure(s) (LRB): Left fibular and tibial open treatment with internal fixation (Left) Complicated by post-op respiratory failure  NWB L LE Medical management by CCM team Will continue to follow  Alfredo Martinez PA-C [facility] Office:  [phone]

## 2018-09-23 NOTE — Progress Notes (Signed)
   09/23/18 0800  OT Visit Information  Last OT Received On 09/23/18  Reason Eval/Treat Not Completed Patient not medically ready (Pt ventilated and sedated.)  Martie Round, OTR/L Acute Rehabilitation Services Pager: [phone] Office: [phone]

## 2018-09-23 NOTE — Progress Notes (Signed)
eLink Physician-Brief Progress Note Patient Name: Rodney Hoffman DOB: 11-22-1965 MRN: [id number]   Date of Service  09/23/2018  HPI/Events of Note  No ordered AM labs  eICU Interventions  CBC, BMET, Mg++ ordered        Migdalia Dk 09/23/2018, 5:45 AM

## 2018-09-24 ENCOUNTER — Inpatient Hospital Stay (HOSPITAL_COMMUNITY): Payer: No Typology Code available for payment source

## 2018-09-24 LAB — BASIC METABOLIC PANEL
ANION GAP: 10 (ref 5–15)
BUN: 62 mg/dL — ABNORMAL HIGH (ref 6–20)
CO2: 27 mmol/L (ref 22–32)
Calcium: 8.2 mg/dL — ABNORMAL LOW (ref 8.9–10.3)
Chloride: 114 mmol/L — ABNORMAL HIGH (ref 98–111)
Creatinine, Ser: 3.41 mg/dL — ABNORMAL HIGH (ref 0.61–1.24)
GFR calc non Af Amer: 19 mL/min — ABNORMAL LOW (ref >60)
GFR, EST AFRICAN AMERICAN: 22 mL/min — AB (ref >60)
Glucose, Bld: 104 mg/dL — ABNORMAL HIGH (ref 70–99)
Potassium: 3.2 mmol/L — ABNORMAL LOW (ref 3.5–5.1)
Sodium: 151 mmol/L — ABNORMAL HIGH (ref 135–145)

## 2018-09-24 LAB — BLOOD GAS, ARTERIAL
Acid-Base Excess: 1.6 mmol/L (ref 0.0–2.0)
Bicarbonate: 26.3 mmol/L (ref 20.0–28.0)
Drawn by: 24513
FIO2: 0.7
O2 Saturation: 93.9 %
PEEP/CPAP: 10 cmH2O
Patient temperature: 99.4
RATE: 20 resp/min
VT: 550 mL
pCO2 arterial: 47 mmHg (ref 32.0–48.0)
pH, Arterial: 7.369 (ref 7.350–7.450)
pO2, Arterial: 77.2 mmHg — ABNORMAL LOW (ref 83.0–108.0)

## 2018-09-24 LAB — GLUCOSE, CAPILLARY
GLUCOSE-CAPILLARY: 71 mg/dL (ref 70–99)
Glucose-Capillary: 106 mg/dL — ABNORMAL HIGH (ref 70–99)
Glucose-Capillary: 133 mg/dL — ABNORMAL HIGH (ref 70–99)
Glucose-Capillary: 93 mg/dL (ref 70–99)
Glucose-Capillary: 93 mg/dL (ref 70–99)
Glucose-Capillary: 77 mg/dL (ref 70–99)

## 2018-09-24 LAB — PHOSPHORUS
Phosphorus: 5.5 mg/dL — ABNORMAL HIGH (ref 2.5–4.6)
Phosphorus: 5.3 mg/dL — ABNORMAL HIGH (ref 2.5–4.6)
Phosphorus: 4.8 mg/dL — ABNORMAL HIGH (ref 2.5–4.6)

## 2018-09-24 LAB — MAGNESIUM
MAGNESIUM: 2.3 mg/dL (ref 1.7–2.4)
Magnesium: 2.2 mg/dL (ref 1.7–2.4)
Magnesium: 2.3 mg/dL (ref 1.7–2.4)

## 2018-09-24 MED ORDER — VITAL HIGH PROTEIN PO LIQD: 1000.0000 mL | ORAL | Status: DC

## 2018-09-24 MED ORDER — VITAL HIGH PROTEIN PO LIQD
1000.0000 mL | ORAL | Status: DC
  Administered 2018-09-24 – 2018-09-28 (×7): 1000 mL

## 2018-09-24 MED ORDER — FUROSEMIDE 10 MG/ML IJ SOLN
40.0000 mg | Freq: Once | INTRAMUSCULAR | Status: AC
  Administered 2018-09-24: 40 mg via INTRAVENOUS
  Filled 2018-09-24: qty 4

## 2018-09-24 MED ORDER — POTASSIUM CHLORIDE 20 MEQ/15ML (10%) PO SOLN
40.0000 meq | ORAL | Status: AC
  Administered 2018-09-24 – 2018-09-25 (×2): 40 meq
  Filled 2018-09-24 (×2): qty 30

## 2018-09-24 MED ORDER — DEXTROSE 5 % IV SOLN
INTRAVENOUS | Status: DC
  Administered 2018-09-24: 15:00:00 via INTRAVENOUS

## 2018-09-24 MED ORDER — PRO-STAT SUGAR FREE PO LIQD: 30.0000 mL | Freq: Two times a day (BID) | ORAL | Status: DC

## 2018-09-24 MED ORDER — PRO-STAT SUGAR FREE PO LIQD
30.0000 mL | Freq: Three times a day (TID) | ORAL | Status: DC
  Administered 2018-09-24 – 2018-09-27 (×8): 30 mL
  Filled 2018-09-24 (×8): qty 30

## 2018-09-24 NOTE — Procedures (Signed)
Cortrak  Person Inserting Tube:  Christophe Louis A, RD Tube Type:  Cortrak - 43 inches Tube Location:  Right nare Initial Placement:  Stomach Secured by: Bridle Technique Used to Measure Tube Placement:  Documented cm marking at nare/ corner of mouth Cortrak Secured At:  79 cm    Cortrak Tube Team Note: Consult received to place a Cortrak feeding tube.   Per cortak monitor, tube is in distal stomach.  No x-ray is required. RN may begin using tube.   Will restart existing TF orders- Vital HP @ 20. Advance by 10cc q 8 hrs until goal rate of 65 ml/hr. Prostat 30 ml TID.  If the tube becomes dislodged please keep the tube and contact the Cortrak team at [email or web address] ([email or web address]) for replacement.  If after hours and replacement cannot be delayed, place a NG tube and confirm placement with an abdominal x-ray.   Christophe Louis RD, LDN, CNSC Clinical Nutrition Available Tues-Sat via Pager: [phone] 09/24/2018 2:21 PM

## 2018-09-24 NOTE — Progress Notes (Signed)
Elink called about K 3.2. Will check for orders.

## 2018-09-24 NOTE — Progress Notes (Signed)
eLink Physician-Brief Progress Note Patient Name: Rodney Hoffman DOB: 12/08/1965 MRN: [id number]   Date of Service  09/24/2018  HPI/Events of Note  Hypokalemia  eICU Interventions  Potassium replaced     Intervention Category Intermediate Interventions: Electrolyte abnormality - evaluation and management  [name] 09/24/2018, 10:46 PM

## 2018-09-24 NOTE — Progress Notes (Signed)
Pt still w/out BM since admit, however bowel sounds active and TF have been off frequently. Relayed to Dr. Craige Cotta.

## 2018-09-24 NOTE — Progress Notes (Signed)
Pts K:3.2, have paged CCM

## 2018-09-24 NOTE — Progress Notes (Signed)
NAME:  Rodney GoodJames Hoffman, MRN:  [id number], DOB:  07/18/1966, LOS: 9 ADMISSION DATE:  11/08/18, CONSULTATION DATE:  1/16 REFERRING MD:  Dr. Victorino DikeHewitt, CHIEF COMPLAINT:  Post op respiratory failure  Brief History   This is a 53 year old male with a history of HTN, OSA and DM who after getting a ORIF of his left fibular and tibial developed hypoxic and hypercarbic respiratory failure requiring intubation and transfer to [facility] and the ICU for airway management.  History of present illness   53 year old male with past medical history as below, which is significant for tobacco abuse and diabetes.  The patient is encephalopathic and therefore history is obtained largely from chart review.  He suffered a left ankle fracture about a year ago and after some lifestyle modification and better management of his diabetes he presented on 1/16 to the outpatient surgery center for left ankle ORIF under Dr. Victorino DikeHewitt.  He was extubated immediately postoperatively, however, he then developed hypoxemia with sats down into the low 80s and altered mental status progressing to obtundation.  He was transferred to [facility] and [facility] for airway management.  He was given 120 mg IV lasix and had poor output, he became profoundly hypotensive. Bedside u/s showed dilated RV and hyperdynamic LV. Cardiology called and patient was started on levophed drip. He initially had respiratory acidosis and was started on bicarbonate drip, follow up ABG showed respiratory alkalosis so bicarbonate drip was stopped.    Past Medical History   Past Medical History:  Diagnosis Date  . Diabetes mellitus without complication (HCC)   . Hypertension   . Sleep apnea    does not wear CPAP     Significant Hospital Events   1/16 ORIF L ankle. Failed extubation. Transfer to [facility] 1/23 > Failed extubation, CODE BLUE called for respiratory distress and pulseless V tach and reintubated  Consults:    Procedures:  1/16 L ankle ORIF (Dr.  Victorino DikeHewitt) 1/16 ETT  Significant Diagnostic Tests:   Echocardiogram 09/16/18 Impressions:  - Normal LV size with moderate LV hypertrophy. EF 50-55%, low   normal to mildly decreased. Basal inferior akinesis. Mildly   dilated RV with mild to moderately decreased systolic function.   Dilated IVC suggestive of elevated RV filling pressure.  CT head 09/21/18   IMPRESSION: No acute intracranial normality.  Pansinus disease and bilateral mastoid effusions. Micro Data:    Antimicrobials:     Interim history/subjective:  Remains on increased PEEP, FiO2.  Objective   Blood pressure (!) 213/106, pulse (!) 112, temperature 99.2 F (37.3 C), temperature source Axillary, resp. rate 20, height 6\' 1"  (1.854 m), weight (!) 151.9 kg, SpO2 97 %.    Vent Mode: PRVC FiO2 (%):  [60 %-80 %] 60 % Set Rate:  [20 bmp] 20 bmp Vt Set:  [550 mL] 550 mL PEEP:  [10 cmH20] 10 cmH20 Plateau Pressure:  [24 cmH20-32 cmH20] 32 cmH20   Intake/Output Summary (Last 24 hours) at 09/24/2018 1229 Last data filed at 09/24/2018 1100 Gross per 24 hour  Intake 3264.15 ml  Output 2880 ml  Net 384.15 ml   Filed Weights   09/22/18 0500 09/23/18 0328 09/24/18 0403  Weight: (!) 153.2 kg (!) 150.4 kg (!) 151.9 kg    Examination:  General - sedated, intermittent shivering Eyes - pupils reactive ENT - ETT in place Cardiac - regular rate/rhythm, no murmur Chest - b/l rhonchi Abdomen - soft, non tender, + bowel sounds Extremities - 1+ edema Skin - no  rashes Neuro - RASS -3     Resolved Hospital Problem list     Assessment & Plan:   Acute respiratory failure with hypoxia from acute pulmonary edema and aspiration pneumonia. Post extubation stridor. Difficult airway. Plan - full vent support - will need trach - f/u CXR  Acute metabolic encephalopathy. Plan - RASS goal -2 to -3  Sepsis with viridans strep bacteremia, aspiration pneumonia. Plan - continue rocephin  Acute renal failure with  ATN. Hypernatremia. Plan - f/u BMET - D5W IV fluid at 40 ml/hr - decrease lasix  Left ankle injury. Plan -S/p ORIF on 1/16 by Dr. Victorino DikeHewitt -Per ortho >> reinforce splint as needed, will need PT/OT as he becomes stable. No evidence of lymphedema or streaking up left leg.   DM. Plan - SSI  Best practice:  Diet: NPO; will need coretrak DVT prophylaxis: Heparin GI prophylaxis: PPI Glucose control: SSI Mobility: Bed rest Code Status: Full Family Communication: no family at bedside  Labs   CBC: Recent Labs  Lab 09/18/18 0439 09/20/18 0419 09/21/18 0345 09/22/18 0353 09/22/18 1504 09/23/18 0550 09/23/18 1239  WBC 5.9 6.6 6.6 6.2 9.6 7.2  --   NEUTROABS 4.3  --  4.4  --   --  5.0  --   HGB 13.0 13.4 12.9* 12.7* 12.8* 12.3* 13.6  HCT 42.8 47.0 43.9 44.1 43.6 42.9 40.0  MCV 95.5 97.7 96.9 96.3 96.5 97.9  --   PLT 201 199 228 241 254 250  --   3  Basic Metabolic Panel: Recent Labs  Lab 09/17/18 1843 09/18/18 0439  09/20/18 0419 09/21/18 0345 09/22/18 0404 09/22/18 1504 09/23/18 0550 09/23/18 1223 09/23/18 1239 09/23/18 1547 09/24/18 0343  NA  --  145   < > 145 147* 148* 151* 151*  --  151*  --   --   K  --  3.6   < > 3.9 3.8 3.5 3.4* 3.7  --  3.7  --   --   CL  --  108   < > 111 108 110 108 112*  --   --   --   --   CO2  --  25   < > 28 28 29 30 27   --   --   --   --   GLUCOSE  --  104*   < > 146* 158* 132* 124* 133*  --   --   --   --   BUN  --  43*   < > 47* 50* 56* 58* 62*  --   --   --   --   CREATININE  --  2.19*   < > 2.17* 2.15* 2.29* 2.90* 2.79*  --   --   --   --   CALCIUM  --  7.8*   < > 8.6* 8.9 8.8* 8.3* 8.5*  --   --   --   --   MG 2.0 2.3  --   --  2.5* 2.3 2.3 2.2 2.3  --  2.1 2.2  PHOS 4.4 3.8  --   --   --   --   --   --  4.4  --  4.6 5.5*   < > = values in this interval not displayed.   GFR: Estimated Creatinine Clearance: 46.5 mL/min (A) (by C-G formula based on SCr of 2.79 mg/dL (H)). Recent Labs  Lab 09/18/18 [id number] 09/19/18 0406   09/21/18 0345 09/22/18 0353 09/22/18 1504 09/23/18 0550  PROCALCITON 7.05 5.32  --   --   --   --   --   WBC 5.9  --    < > 6.6 6.2 9.6 7.2  LATICACIDVEN  --   --   --   --   --  0.9  --    < > = values in this interval not displayed.    Liver Function Tests: Recent Labs  Lab 09/22/18 1504  AST 74*  ALT 68*  ALKPHOS 113  BILITOT 1.3*  PROT 6.6  ALBUMIN 2.5*   No results for input(s): LIPASE, AMYLASE in the last 168 hours. No results for input(s): AMMONIA in the last 168 hours.  ABG    Component Value Date/Time   PHART 7.369 09/24/2018 0355   PCO2ART 47.0 09/24/2018 0355   PO2ART 77.2 (L) 09/24/2018 0355   HCO3 26.3 09/24/2018 0355   TCO2 34 (H) 09/23/2018 1239   O2SAT 93.9 09/24/2018 0355     Coagulation Profile: No results for input(s): INR, PROTIME in the last 168 hours.  Cardiac Enzymes: Recent Labs  Lab 09/22/18 0404 09/22/18 1648  TROPONINI 0.03* 0.21*    HbA1C: No results found for: HGBA1C  CBG: Recent Labs  Lab 09/23/18 1528 09/23/18 1953 09/23/18 2351 09/24/18 0351 09/24/18 0819  GLUCAP 100* 116* 106* 133* 93   CC time 32 minutes  Coralyn Helling, MD [facility] Pulmonary/Critical Care 09/24/2018, 12:35 PM

## 2018-09-24 NOTE — Progress Notes (Signed)
PT Cancellation Note  Patient Details Name: Rodney Hoffman MRN: [id number] DOB: [birth year]   Cancelled Treatment:     Acknowledged discontinue orders 1/25. Please re-order if/when appropriate.   Etta Grandchild, PT, DPT Acute Rehabilitation Services Pager: [phone] Office: [phone]    Etta Grandchild 09/24/2018, 2:05 PM

## 2018-09-25 ENCOUNTER — Inpatient Hospital Stay (HOSPITAL_COMMUNITY): Payer: No Typology Code available for payment source

## 2018-09-25 LAB — BLOOD GAS, ARTERIAL
Acid-Base Excess: 2.4 mmol/L — ABNORMAL HIGH (ref 0.0–2.0)
Bicarbonate: 28.1 mmol/L — ABNORMAL HIGH (ref 20.0–28.0)
Drawn by: 511911
FIO2: 100
MECHVT: 550 mL
O2 Saturation: 88.5 %
PEEP: 10 cmH2O
Patient temperature: 98.6
RATE: 20 resp/min
pCO2 arterial: 57.2 mmHg — ABNORMAL HIGH (ref 32.0–48.0)
pH, Arterial: 7.312 — ABNORMAL LOW (ref 7.350–7.450)
pO2, Arterial: 63.2 mmHg — ABNORMAL LOW (ref 83.0–108.0)

## 2018-09-25 LAB — BASIC METABOLIC PANEL
Anion gap: 11 (ref 5–15)
BUN: 57 mg/dL — ABNORMAL HIGH (ref 6–20)
CO2: 26 mmol/L (ref 22–32)
Calcium: 8.2 mg/dL — ABNORMAL LOW (ref 8.9–10.3)
Chloride: 113 mmol/L — ABNORMAL HIGH (ref 98–111)
Creatinine, Ser: 3.06 mg/dL — ABNORMAL HIGH (ref 0.61–1.24)
GFR calc Af Amer: 25 mL/min — ABNORMAL LOW (ref >60)
GFR calc non Af Amer: 22 mL/min — ABNORMAL LOW (ref >60)
Glucose, Bld: 143 mg/dL — ABNORMAL HIGH (ref 70–99)
Potassium: 3.7 mmol/L (ref 3.5–5.1)
Sodium: 150 mmol/L — ABNORMAL HIGH (ref 135–145)

## 2018-09-25 LAB — GLUCOSE, CAPILLARY
Glucose-Capillary: 103 mg/dL — ABNORMAL HIGH (ref 70–99)
Glucose-Capillary: 113 mg/dL — ABNORMAL HIGH (ref 70–99)
Glucose-Capillary: 118 mg/dL — ABNORMAL HIGH (ref 70–99)
Glucose-Capillary: 119 mg/dL — ABNORMAL HIGH (ref 70–99)
Glucose-Capillary: 146 mg/dL — ABNORMAL HIGH (ref 70–99)
Glucose-Capillary: 74 mg/dL (ref 70–99)
Glucose-Capillary: 98 mg/dL (ref 70–99)

## 2018-09-25 LAB — PHOSPHORUS
PHOSPHORUS: 4.4 mg/dL (ref 2.5–4.6)
Phosphorus: 3.5 mg/dL (ref 2.5–4.6)

## 2018-09-25 LAB — CBC
HCT: 39.4 % (ref 39.0–52.0)
Hemoglobin: 11.5 g/dL — ABNORMAL LOW (ref 13.0–17.0)
MCH: 28.3 pg (ref 26.0–34.0)
MCHC: 29.2 g/dL — AB (ref 30.0–36.0)
MCV: 97 fL (ref 80.0–100.0)
Platelets: 274 10*3/uL (ref 150–400)
RBC: 4.06 MIL/uL — ABNORMAL LOW (ref 4.22–5.81)
RDW: 14.8 % (ref 11.5–15.5)
WBC: 7 10*3/uL (ref 4.0–10.5)
nRBC: 0 % (ref 0.0–0.2)

## 2018-09-25 LAB — MAGNESIUM
Magnesium: 2.3 mg/dL (ref 1.7–2.4)
Magnesium: 2.3 mg/dL (ref 1.7–2.4)

## 2018-09-25 LAB — TRIGLYCERIDES: Triglycerides: 445 mg/dL — ABNORMAL HIGH (ref <150)

## 2018-09-25 MED ORDER — LABETALOL HCL 5 MG/ML IV SOLN
INTRAVENOUS | Status: AC
  Filled 2018-09-25: qty 4

## 2018-09-25 MED ORDER — IPRATROPIUM-ALBUTEROL 0.5-2.5 (3) MG/3ML IN SOLN: 3.0000 mL | Freq: Four times a day (QID) | RESPIRATORY_TRACT | Status: DC | PRN

## 2018-09-25 MED ORDER — ROCURONIUM BROMIDE 50 MG/5ML IV SOLN
100.0000 mg | Freq: Once | INTRAVENOUS | Status: AC
  Administered 2018-09-25: 100 mg via INTRAVENOUS
  Filled 2018-09-25: qty 10

## 2018-09-25 MED ORDER — FENTANYL CITRATE (PF) 100 MCG/2ML IJ SOLN
50.0000 ug | Freq: Once | INTRAMUSCULAR | Status: AC
  Administered 2018-09-25: 50 ug via INTRAVENOUS

## 2018-09-25 MED ORDER — CLONAZEPAM 1 MG PO TABS
1.0000 mg | ORAL_TABLET | Freq: Four times a day (QID) | ORAL | Status: DC
  Administered 2018-09-25 – 2018-10-03 (×30): 1 mg
  Filled 2018-09-25 (×31): qty 1

## 2018-09-25 MED ORDER — PANTOPRAZOLE SODIUM 40 MG PO PACK
40.0000 mg | PACK | Freq: Every day | ORAL | Status: DC
  Administered 2018-09-25 – 2018-10-02 (×7): 40 mg
  Filled 2018-09-25 (×8): qty 20

## 2018-09-25 MED ORDER — QUETIAPINE FUMARATE 50 MG PO TABS
50.0000 mg | ORAL_TABLET | Freq: Two times a day (BID) | ORAL | Status: DC
  Administered 2018-09-25 – 2018-09-26 (×3): 50 mg
  Filled 2018-09-25 (×3): qty 1

## 2018-09-25 MED ORDER — PHENYLEPHRINE 40 MCG/ML (10ML) SYRINGE FOR IV PUSH (FOR BLOOD PRESSURE SUPPORT)
80.0000 ug | PREFILLED_SYRINGE | INTRAVENOUS | Status: DC | PRN
  Filled 2018-09-25: qty 10

## 2018-09-25 NOTE — Progress Notes (Signed)
Called lab to find out why nasal/sinus aspirate specimen had no results. Lab stated that it was not ordered correctly on 1/24 was ordered as a body fluid culture) - ordered correctly as per lab needed and lab stated they would set up the specimen for culture

## 2018-09-25 NOTE — Progress Notes (Signed)
NAME:  Rodney Hoffman, MRN:  [id number], DOB:  1966-01-08, LOS: 10 ADMISSION DATE:  Oct 11, 2018, CONSULTATION DATE:  1/16 REFERRING MD:  Dr. Victorino Dike, CHIEF COMPLAINT:  Post op respiratory failure  Brief History   This is a 53 year old male with a history of HTN, OSA and DM who after getting a ORIF of his left fibular and tibial developed hypoxic and hypercarbic respiratory failure requiring intubation and transfer to [facility] and the ICU for airway management.  History of present illness   53 year old male with past medical history as below, which is significant for tobacco abuse and diabetes.  The patient is encephalopathic and therefore history is obtained largely from chart review.  He suffered a left ankle fracture about a year ago and after some lifestyle modification and better management of his diabetes he presented on 1/16 to the outpatient surgery center for left ankle ORIF under Dr. Victorino Dike.  He was extubated immediately postoperatively, however, he then developed hypoxemia with sats down into the low 80s and altered mental status progressing to obtundation.  He was transferred to [facility] and [facility] for airway management.  He was given 120 mg IV lasix and had poor output, he became profoundly hypotensive. Bedside u/s showed dilated RV and hyperdynamic LV. Cardiology called and patient was started on levophed drip. He initially had respiratory acidosis and was started on bicarbonate drip, follow up ABG showed respiratory alkalosis so bicarbonate drip was stopped.    Past Medical History   Past Medical History:  Diagnosis Date  . Diabetes mellitus without complication (HCC)   . Hypertension   . Sleep apnea    does not wear CPAP     Significant Hospital Events   1/16 ORIF L ankle. Failed extubation. Transfer to [facility] 1/23 > Failed extubation, CODE BLUE called for respiratory distress and pulseless V tach and reintubated  Consults:    Procedures:  1/16 L ankle ORIF (Dr.  Victorino Dike) 1/16 ETT  Significant Diagnostic Tests:   Echocardiogram 09/16/18 Impressions:  - Normal LV size with moderate LV hypertrophy. EF 50-55%, low   normal to mildly decreased. Basal inferior akinesis. Mildly   dilated RV with mild to moderately decreased systolic function.   Dilated IVC suggestive of elevated RV filling pressure.  CT head 09/21/18   IMPRESSION: No acute intracranial normality.  Pansinus disease and bilateral mastoid effusions. Micro Data:   Blood cultures 1/18 > strep viridans in 1/4 bottles, possible contaminant Blood cultures 1/22 > NGTD  Antimicrobials:      Interim history/subjective:  No acute events overnight. Patient was intubated and sedated, not following commands. He is on high FiO2 and PEEP.   Objective   Blood pressure 112/81, pulse 77, temperature 98.7 F (37.1 C), temperature source Oral, resp. rate 20, height 6\' 1"  (1.854 m), weight (!) 149.3 kg, SpO2 96 %.    Vent Mode: PRVC FiO2 (%):  [60 %-100 %] 100 % Set Rate:  [20 bmp] 20 bmp Vt Set:  [550 mL] 550 mL PEEP:  [10 cmH20] 10 cmH20 Plateau Pressure:  [24 cmH20-32 cmH20] 32 cmH20   Intake/Output Summary (Last 24 hours) at 09/25/2018 0646 Last data filed at 09/25/2018 0400 Gross per 24 hour  Intake 3056.58 ml  Output 4015 ml  Net -958.42 ml   Filed Weights   09/23/18 0328 09/24/18 0403 09/25/18 0500  Weight: (!) 150.4 kg (!) 151.9 kg (!) 149.3 kg    Examination: General: Intubated, left IJ central line in place, right femoral  line in place, obese male, sedated HENT: Normocephalic, atraumatic Lungs: Bibasilar crackles, on ventilator Cardiovascular: RRR, no m/r/g Abdomen: soft, no guarding Extremities: Left leg in splint, trace edema Neuro: Sedated GU: Foley in place  Resolved Hospital Problem list     Assessment & Plan:   Acute hypoxic respiratory failure:  Currently intubated: Respiratory acidosis: -Vent settings this morning was FiO2 100%, PEEP 10. Will change  setting to FiO2 70% and PEEP 12.  -CXR appears unchanged from prior, continue to monitor.  -Initial blood cultures showed that 1 out of 4 bottles grew strep viridans -Continue ceftriaxone -Repeat blood cultures have no growth to date, will continue to follow -Pulmonary hygiene  -Continue Versed, dilaudid, and propofol drip -Start klonopin 1 mg q6 hr -Start seroquel 50 mg BID -Attempt to wean IV sedative -Continue hydralazine 5 mg q4hr PRN -Monitor CVP -Plan for tracheostomy, possibly tomorrow  Shock: Most likely multifactorial in the setting of attempted diuresis, grade 1 diastolic dysfunction, dilated right ventricle, elevated right ventricle pressures made worse by propofol drip. -Not on any pressors at the moment -Cortisol level > 134 -Blood Cultures > strep viridans growing in 1/4 bottles -Repeat blood cultures > no growth to date  AKI: Creatinine  3.06 today. Will hold off on diuresis today.   Hypernatremia: -Na up to 150 today -Continue free water to 200 q6 hours -Repeat BMP in AM  Left ankle injury -S/p ORIF on 1/16 by Dr. Victorino DikeHewitt -Per ortho >> reinforce splint as needed, will need PT/OT as he becomes stable. No evidence of lymphedema or streaking up left leg.   DM: -SSI -Frequent CBGs  HTN: -Patient is on lisinopril at home, BP today was on the softer side and he had required a levoped drip.  -Hold Lisinopril  -Can restart as necessary   Best practice:  Diet: Tube feeds Pain/Anxiety/Delirium protocol (if indicated): Versed and dilaudid for RASS -1 to -2 VAP protocol (if indicated): Yes DVT prophylaxis: Heparin GI prophylaxis: PPI Glucose control: SSI Mobility: Bed ridden Code Status: Full Family Communication:  Disposition: Remain in ICU  Labs   CBC: Recent Labs  Lab 09/21/18 0345 09/22/18 0353 09/22/18 1504 09/23/18 0550 09/23/18 1239 09/25/18 0345  WBC 6.6 6.2 9.6 7.2  --  7.0  NEUTROABS 4.4  --   --  5.0  --   --   HGB 12.9* 12.7* 12.8* 12.3*  13.6 11.5*  HCT 43.9 44.1 43.6 42.9 40.0 39.4  MCV 96.9 96.3 96.5 97.9  --  97.0  PLT 228 241 254 250  --  274  3  Basic Metabolic Panel: Recent Labs  Lab 09/22/18 0404 09/22/18 1504 09/23/18 0550  09/23/18 1239 09/23/18 1547 09/24/18 0343 09/24/18 1459 09/24/18 2238 09/25/18 0345  NA 148* 151* 151*  --  151*  --   --  151*  --  150*  K 3.5 3.4* 3.7  --  3.7  --   --  3.2*  --  3.7  CL 110 108 112*  --   --   --   --  114*  --  113*  CO2 29 30 27   --   --   --   --  27  --  26  GLUCOSE 132* 124* 133*  --   --   --   --  104*  --  143*  BUN 56* 58* 62*  --   --   --   --  62*  --  57*  CREATININE 2.29* 2.90*  2.79*  --   --   --   --  3.41*  --  3.06*  CALCIUM 8.8* 8.3* 8.5*  --   --   --   --  8.2*  --  8.2*  MG 2.3 2.3 2.2   < >  --  2.1 2.2 2.3 2.3 2.3  PHOS  --   --   --    < >  --  4.6 5.5* 5.3* 4.8* 4.4   < > = values in this interval not displayed.   GFR: Estimated Creatinine Clearance: 42 mL/min (A) (by C-G formula based on SCr of 3.06 mg/dL (H)). Recent Labs  Lab 09/19/18 0406  09/22/18 0353 09/22/18 1504 09/23/18 0550 09/25/18 0345  PROCALCITON 5.32  --   --   --   --   --   WBC  --    < > 6.2 9.6 7.2 7.0  LATICACIDVEN  --   --   --  0.9  --   --    < > = values in this interval not displayed.    Liver Function Tests: Recent Labs  Lab 09/22/18 1504  AST 74*  ALT 68*  ALKPHOS 113  BILITOT 1.3*  PROT 6.6  ALBUMIN 2.5*   No results for input(s): LIPASE, AMYLASE in the last 168 hours. No results for input(s): AMMONIA in the last 168 hours.  ABG    Component Value Date/Time   PHART 7.312 (L) 09/25/2018 0615   PCO2ART 57.2 (H) 09/25/2018 0615   PO2ART 63.2 (L) 09/25/2018 0615   HCO3 28.1 (H) 09/25/2018 0615   TCO2 34 (H) 09/23/2018 1239   O2SAT 88.5 09/25/2018 0615     Coagulation Profile: No results for input(s): INR, PROTIME in the last 168 hours.  Cardiac Enzymes: Recent Labs  Lab 09/22/18 0404 09/22/18 1648  TROPONINI 0.03* 0.21*     HbA1C: No results found for: HGBA1C  CBG: Recent Labs  Lab 09/24/18 1235 09/24/18 1614 09/24/18 1950 09/24/18 2356 09/25/18 0401  GLUCAP 71 93 77 74 98    Review of Systems:   Currently intubated and sedated  Past Medical History  He,  has a past medical history of Diabetes mellitus without complication (HCC), Hypertension, and Sleep apnea.   Surgical History    Past Surgical History:  Procedure Laterality Date  . APPENDECTOMY  1979  . ORIF ANKLE FRACTURE Left 10/14/2018   Procedure: Left fibular and tibial open treatment with internal fixation;  Surgeon: Toni Arthurs, MD;  Location: [facility];  Service: Orthopedics;  Laterality: Left;   Outpatient in bed     Social History   reports that he quit smoking about 2 months ago. His smoking use included cigarettes. He has never used smokeless tobacco. He reports previous alcohol use. He reports that he does not use drugs.   Family History   His family history is not on file.   Allergies No Known Allergies   Home Medications  Prior to Admission medications   Medication Sig Start Date End Date Taking? Authorizing Provider  glipiZIDE (GLUCOTROL) 5 MG tablet Take by mouth 2 (two) times daily before a meal.   Yes [provider]  lisinopril (PRINIVIL,ZESTRIL) 10 MG tablet Take 10 mg by mouth daily.   Yes [provider]  metFORMIN (GLUCOPHAGE) 500 MG tablet Take by mouth 2 (two) times daily with a meal.   Yes [provider]  aspirin EC 81 MG tablet Take 1 tablet (81 mg  total) by mouth 2 (two) times daily. 2018-10-12   Jacinta Shoe, PA-C  docusate sodium (COLACE) 100 MG capsule Take 1 capsule (100 mg total) by mouth 2 (two) times daily. While taking narcotic pain medicine. October 12, 2018   Jacinta Shoe, PA-C  oxyCODONE (ROXICODONE) 5 MG immediate release tablet Take 1 tablet (5 mg total) by mouth every 4 (four) hours as needed for up to 5 days for severe pain. 2018-10-12  09/20/18  Jacinta Shoe, PA-C  senna (SENOKOT) 8.6 MG TABS tablet Take 2 tablets (17.2 mg total) by mouth 2 (two) times daily. 10-12-2018   Jacinta Shoe, PA-C     Critical care time:     Claudean Severance, M.D. PGY1 Pager [phone] 09/25/2018 6:46 AM

## 2018-09-25 NOTE — Plan of Care (Signed)
At approximately 10:30pm on 09/25/2018 we were notified that the patient was biting his tube and had a persistent air leak despite multiple attempts at refilling his endotracheal cuff. This was after the patient had his bite block removed earlier in the day. In order to maintain a protective airway the patient was neuromuscularly blocked with rocuronium prior to an airway inspection via glidescope which revealed the cuff inflated above the patient's vocal chords. The ET tube was advanced by 2cm until the cuff was not into view and the audible leak resolved. The tube was then re-established to the tube holder and a bite block was then placed and a CXR was ordered to confirm appropriate placement in the trachea. The patient did become transiently hypertensive however this quickly resolved and overall the patient tolerated the inspection well.   Aida Puffer, MD  Pulmonary and Critical Care Medicine [facility] Pager: [phone]

## 2018-09-26 ENCOUNTER — Inpatient Hospital Stay (HOSPITAL_COMMUNITY): Payer: No Typology Code available for payment source

## 2018-09-26 DIAGNOSIS — J9601 Acute respiratory failure with hypoxia: Secondary | ICD-10-CM

## 2018-09-26 DIAGNOSIS — J90 Pleural effusion, not elsewhere classified: Secondary | ICD-10-CM

## 2018-09-26 DIAGNOSIS — R579 Shock, unspecified: Secondary | ICD-10-CM

## 2018-09-26 LAB — POCT I-STAT 7, (LYTES, BLD GAS, ICA,H+H)
Acid-Base Excess: 3 mmol/L — ABNORMAL HIGH (ref 0.0–2.0)
Bicarbonate: 26.7 mmol/L (ref 20.0–28.0)
CALCIUM ION: 1.12 mmol/L — AB (ref 1.15–1.40)
HCT: 32 % — ABNORMAL LOW (ref 39.0–52.0)
Hemoglobin: 10.9 g/dL — ABNORMAL LOW (ref 13.0–17.0)
O2 Saturation: 94 %
Patient temperature: 100.2
Potassium: 3.5 mmol/L (ref 3.5–5.1)
Sodium: 151 mmol/L — ABNORMAL HIGH (ref 135–145)
TCO2: 28 mmol/L (ref 22–32)
pCO2 arterial: 37.9 mmHg (ref 32.0–48.0)
pH, Arterial: 7.46 — ABNORMAL HIGH (ref 7.350–7.450)
pO2, Arterial: 68 mmHg — ABNORMAL LOW (ref 83.0–108.0)

## 2018-09-26 LAB — TRIGLYCERIDES: Triglycerides: 394 mg/dL — ABNORMAL HIGH (ref <150)

## 2018-09-26 LAB — GLUCOSE, CAPILLARY
Glucose-Capillary: 105 mg/dL — ABNORMAL HIGH (ref 70–99)
Glucose-Capillary: 109 mg/dL — ABNORMAL HIGH (ref 70–99)
Glucose-Capillary: 129 mg/dL — ABNORMAL HIGH (ref 70–99)
Glucose-Capillary: 136 mg/dL — ABNORMAL HIGH (ref 70–99)
Glucose-Capillary: 140 mg/dL — ABNORMAL HIGH (ref 70–99)
Glucose-Capillary: 148 mg/dL — ABNORMAL HIGH (ref 70–99)

## 2018-09-26 LAB — BLOOD GAS, ARTERIAL
Acid-Base Excess: 1.8 mmol/L (ref 0.0–2.0)
Bicarbonate: 27.4 mmol/L (ref 20.0–28.0)
Drawn by: 30136
FIO2: 80
MECHVT: 550 mL
O2 Saturation: 90.2 %
PEEP: 12 cmH2O
Patient temperature: 101
RATE: 20 resp/min
pCO2 arterial: 60.2 mmHg — ABNORMAL HIGH (ref 32.0–48.0)
pH, Arterial: 7.289 — ABNORMAL LOW (ref 7.350–7.450)
pO2, Arterial: 74.7 mmHg — ABNORMAL LOW (ref 83.0–108.0)

## 2018-09-26 LAB — CULTURE, BLOOD (ROUTINE X 2)
Culture: NO GROWTH
Culture: NO GROWTH
SPECIAL REQUESTS: ADEQUATE
Special Requests: ADEQUATE

## 2018-09-26 LAB — COMPREHENSIVE METABOLIC PANEL
ALK PHOS: 81 U/L (ref 38–126)
ALT: 44 U/L (ref 0–44)
AST: 34 U/L (ref 15–41)
Albumin: 2.2 g/dL — ABNORMAL LOW (ref 3.5–5.0)
Anion gap: 12 (ref 5–15)
BUN: 52 mg/dL — ABNORMAL HIGH (ref 6–20)
CO2: 24 mmol/L (ref 22–32)
CREATININE: 2.43 mg/dL — AB (ref 0.61–1.24)
Calcium: 8.3 mg/dL — ABNORMAL LOW (ref 8.9–10.3)
Chloride: 112 mmol/L — ABNORMAL HIGH (ref 98–111)
GFR calc Af Amer: 34 mL/min — ABNORMAL LOW (ref >60)
GFR calc non Af Amer: 29 mL/min — ABNORMAL LOW (ref >60)
Glucose, Bld: 148 mg/dL — ABNORMAL HIGH (ref 70–99)
Potassium: 3.5 mmol/L (ref 3.5–5.1)
Sodium: 148 mmol/L — ABNORMAL HIGH (ref 135–145)
Total Bilirubin: 1.4 mg/dL — ABNORMAL HIGH (ref 0.3–1.2)
Total Protein: 6.2 g/dL — ABNORMAL LOW (ref 6.5–8.1)

## 2018-09-26 LAB — CBC
HCT: 38.5 % — ABNORMAL LOW (ref 39.0–52.0)
Hemoglobin: 11.4 g/dL — ABNORMAL LOW (ref 13.0–17.0)
MCH: 28.7 pg (ref 26.0–34.0)
MCHC: 29.6 g/dL — ABNORMAL LOW (ref 30.0–36.0)
MCV: 97 fL (ref 80.0–100.0)
Platelets: 301 10*3/uL (ref 150–400)
RBC: 3.97 MIL/uL — ABNORMAL LOW (ref 4.22–5.81)
RDW: 14.7 % (ref 11.5–15.5)
WBC: 6.6 10*3/uL (ref 4.0–10.5)
nRBC: 0 % (ref 0.0–0.2)

## 2018-09-26 MED ORDER — DOCUSATE SODIUM 50 MG/5ML PO LIQD
100.0000 mg | Freq: Every day | ORAL | Status: DC
  Administered 2018-09-26 – 2018-10-03 (×7): 100 mg
  Filled 2018-09-26 (×7): qty 10

## 2018-09-26 MED ORDER — VANCOMYCIN HCL 10 G IV SOLR
1500.0000 mg | INTRAVENOUS | Status: DC
  Administered 2018-09-27 – 2018-09-28 (×2): 1500 mg via INTRAVENOUS
  Filled 2018-09-26 (×3): qty 1500

## 2018-09-26 MED ORDER — FREE WATER
200.0000 mL | Freq: Three times a day (TID) | Status: DC
  Administered 2018-09-26 – 2018-09-27 (×3): 200 mL

## 2018-09-26 MED ORDER — VANCOMYCIN HCL 10 G IV SOLR
2500.0000 mg | Freq: Once | INTRAVENOUS | Status: AC
  Administered 2018-09-26: 2500 mg via INTRAVENOUS
  Filled 2018-09-26: qty 2500

## 2018-09-26 MED ORDER — SODIUM CHLORIDE 0.9 % IV SOLN
2.0000 g | Freq: Two times a day (BID) | INTRAVENOUS | Status: DC
  Administered 2018-09-26 – 2018-09-28 (×5): 2 g via INTRAVENOUS
  Filled 2018-09-26 (×6): qty 2

## 2018-09-26 MED ORDER — QUETIAPINE FUMARATE 100 MG PO TABS
100.0000 mg | ORAL_TABLET | Freq: Two times a day (BID) | ORAL | Status: DC
  Administered 2018-09-26 – 2018-10-03 (×12): 100 mg
  Filled 2018-09-26 (×13): qty 1

## 2018-09-26 MED ORDER — SENNOSIDES-DOCUSATE SODIUM 8.6-50 MG PO TABS
1.0000 | ORAL_TABLET | Freq: Every day | ORAL | Status: DC
  Administered 2018-09-26 – 2018-10-02 (×6): 1
  Filled 2018-09-26 (×8): qty 1

## 2018-09-26 NOTE — Progress Notes (Addendum)
Pharmacy Antibiotic Note  Rodney Hoffman is a 53 y.o. male admitted on 2018/10/10 for intubation due to postoperative respiratory failure s/p ORIF surgery. Patient has not improved clinically and is with suspected infection of unknown origin. Pharmacy has been consulted for broadening antibiotics and to start vancomycin  / cefepime. Patient has had low-grade fevers while on ceftriaxone, tMax 101.1 recently. Patient's SCR has improving closer to baseline: 3.41>3.06>2.43. WBC wnl at 6.6.   Plan: Stop Ceftriaxone Start Cefepime 2g IV every 12 hours Start Vancomycin 2500mg  x1 Vancomycin 1500mg  IV every 24 hours.  Goal AUC: 400-550. Expected AUC: 472. Scr Used: 2.43 Monitor signs/ symptoms of infection, clinical improvement, vancomycin levels at steady-state, renal function and cultures/sensitivities.  Height: 6\' 1"  (185.4 cm) Weight: (!) 336 lb 10.3 oz (152.7 kg) IBW/kg (Calculated) : 79.9  Temp (24hrs), Avg:100.1 F (37.8 C), Min:98.5 F (36.9 C), Max:101.1 F (38.4 C)  Recent Labs  Lab 09/22/18 0353  09/22/18 1504 09/23/18 0550 09/24/18 1459 09/25/18 0345 09/26/18 0419  WBC 6.2  --  9.6 7.2  --  7.0 6.6  CREATININE  --    < > 2.90* 2.79* 3.41* 3.06* 2.43*  LATICACIDVEN  --   --  0.9  --   --   --   --    < > = values in this interval not displayed.    Estimated Creatinine Clearance: 53.6 mL/min (A) (by C-G formula based on SCr of 2.43 mg/dL (H)).    No Known Allergies  Antimicrobials this admission: Ceftriaxone 1/22 >> 1/27 Cefepime 1/27 >> Vancomycin 1/27 >>  Dose adjustments this admission: N/A  Microbiology results: 1/27 rBCx-3: sent 1/22 rBCx-2: negative  1/18 BCx: strept viridans in 1/4 bottles; sensitive to vancomycin, ceftriaxone, clindamycin 1/16 MRSA PCR: negative  Thank you for allowing pharmacy to be a part of this patient's care.  Bradley Ferris, PharmD 09/26/2018 3:06 PM PGY-1 Pharmacy Resident Direct Phone: [phone] Please check [email or web address] for  unit-specific pharmacist phone numbers

## 2018-09-26 NOTE — Progress Notes (Signed)
NAME:  Rodney Hoffman Kliethermes, MRN:  [id number], DOB:  03/27/1966, LOS: 11 ADMISSION DATE:  2019/07/04, CONSULTATION DATE:  1/16 REFERRING MD:  Dr. Victorino DikeHewitt, CHIEF COMPLAINT:  Post op respiratory failure  Brief History   This is a 53 year old male with a history of HTN, OSA and DM who after getting a ORIF of his left fibular and tibial developed hypoxic and hypercarbic respiratory failure requiring intubation and transfer to [facility] and the ICU for airway management.  History of present illness   53 year old male with past medical history as below, which is significant for tobacco abuse and diabetes.  The patient is encephalopathic and therefore history is obtained largely from chart review.  He suffered a left ankle fracture about a year ago and after some lifestyle modification and better management of his diabetes he presented on 1/16 to the outpatient surgery center for left ankle ORIF under Dr. Victorino DikeHewitt.  He was extubated immediately postoperatively, however, he then developed hypoxemia with sats down into the low 80s and altered mental status progressing to obtundation.  He was transferred to [facility] and PCCM for airway management.  He was given 120 mg IV lasix and had poor output, he became profoundly hypotensive. Bedside u/s showed dilated RV and hyperdynamic LV. Cardiology called and patient was started on levophed drip. He initially had respiratory acidosis and was started on bicarbonate drip, follow up ABG showed respiratory alkalosis so bicarbonate drip was stopped.    Past Medical History   Past Medical History:  Diagnosis Date  . Diabetes mellitus without complication (HCC)   . Hypertension   . Sleep apnea    does not wear CPAP     Significant Hospital Events   1/16 ORIF L ankle. Failed extubation. Transfer to [facility]CCM 1/23 > Failed extubation, CODE BLUE called for respiratory distress and pulseless V tach and reintubated  Consults:    Procedures:  1/16 L ankle ORIF (Dr.  Victorino DikeHewitt) 1/16 ETT  Significant Diagnostic Tests:   Echocardiogram 09/16/18 Impressions:  - Normal LV size with moderate LV hypertrophy. EF 50-55%, low   normal to mildly decreased. Basal inferior akinesis. Mildly   dilated RV with mild to moderately decreased systolic function.   Dilated IVC suggestive of elevated RV filling pressure.  CT head 09/21/18   IMPRESSION: No acute intracranial normality.  Pansinus disease and bilateral mastoid effusions. Micro Data:   Blood cultures 1/18 > strep viridans in 1/4 bottles, possible contaminant Blood cultures 1/22 > NGTD  Antimicrobials:      Interim history/subjective:  Overnight patient had been biting his tube with a persistent air leak after he had his bite block removed.  Patient was given rocuronium to inspect the airway with a glidescope that showed the cuff inflated above the patient's vocal cords.  The ET tube was advanced and the cuff was inflated.  He had been on a FiO2 of 100% and PEEP of 12 overnight.  Patient is currently intubated and sedated.  No family at bedside.  Vent settings were able to be decreased to an FiO2 of 70, PEEP of 10, RR 20, and TV 550.  Patient appears comfortable.  CVP was 8-9.  This morning patient was noted to be hypotensive and was restarted on levo fed drip.  Objective   Blood pressure 106/70, pulse 81, temperature 98.5 F (36.9 C), temperature source Oral, resp. rate 20, height 6\' 1"  (1.854 m), weight (!) 152.7 kg, SpO2 94 %.    Vent Mode: PRVC FiO2 (%):  [  60 %-100 %] 70 % Set Rate:  [20 bmp] 20 bmp Vt Set:  [550 mL] 550 mL PEEP:  [10 cmH20-12 cmH20] 10 cmH20 Plateau Pressure:  [26 cmH20-34 cmH20] 26 cmH20   Intake/Output Summary (Last 24 hours) at 09/26/2018 0655 Last data filed at 09/26/2018 0600 Gross per 24 hour  Intake 4236.07 ml  Output 1795 ml  Net 2441.07 ml   Filed Weights   09/24/18 0403 09/25/18 0500 09/26/18 0500  Weight: (!) 151.9 kg (!) 149.3 kg (!) 152.7 kg     Examination: General: Intubated, left IJ central line in place, right femoral line in place, obese male, sedated, not following commands HENT: Normocephalic, atraumatic Lungs: Bibasilar crackles, on ventilator Cardiovascular: RRR, no m/r/g Abdomen: soft, no guarding Extremities: Left leg in splint, trace edema Neuro: Sedated GU: Foley in place  Resolved Hospital Problem list     Assessment & Plan:   Acute hypoxic respiratory failure:  Currently intubated: Respiratory acidosis:  Overnight patient was has had a FiO2 of 100% and a PEEP of 12.  When I was evaluating vent settings had a FiO2 of 70, PEEP of 10, RR 20, and tidal volume 550.  A repeat ABG this morning showed a respiratory acidosis, pH of 7.289, CO2 of 60, and O2 of 74.  Will increase the RR to 23 and repeat ABG in the morning.  Patient did spike a fever up to 101, still not having any leukocytosis however has been having this persistent fever.  Has repeat blood cultures on 1/22 have been negative.  Chest x-ray does show his right pleural effusion and vascular congestion that is consistent with a possible infection.  Initial trach aspirate cultures on 1/18 was negative. However given the persistent fever and high ventilator requirements we will reevaluate for any sort of infection repeat blood cultures in sputum cultures. -Discontinue ceftriaxone -Start Vanco and Zosyn -Repeat blood cultures and respiratory cultures -Pulmonary hygiene  -Continue Versed and propofol drip -Increase Dilaudid drip -Increase Seroquel to 100 mg twice daily -Continue Klonopin 1 mg q6 hr -Attempt to wean IV sedative -Continue hydralazine 5 mg q4hr PRN for hypertension -Monitor CVP -Plan for tracheostomy, possibly tomorrow  Shock: Most likely multifactorial in the setting of attempted diuresis, grade 1 diastolic dysfunction, dilated right ventricle, elevated right ventricle pressures made worse by propofol drip. -Now requiring a low-dose of  Levophed however this is likely due to his high amount of sedative medications -Cortisol level > 134 -Blood Cultures > strep viridans growing in 1/4 bottles -Repeat blood cultures > no growth to date  AKI: -Creatinine improved to 2.43, down from 3.06 yesterday. Will hold off on diuresis today.  -Daily BMP  Hypernatremia: -Na up to 148 today -Continue free water to 200 q8 hours -Repeat BMP in AM  Left ankle injury -S/p ORIF on 1/16 by Dr. Victorino DikeHewitt -Per ortho >> reinforce splint as needed, will need PT/OT as he becomes stable. No evidence of lymphedema or streaking up left leg.   DM: -SSI -Frequent CBGs   HTN: -Patient is on lisinopril at home, BP today was on the softer side and he had required a levoped drip.  -Hold Lisinopril  -Can restart as necessary   Best practice:  Diet: Tube feeds Pain/Anxiety/Delirium protocol (if indicated): Versed and dilaudid for RASS -1 to -2 VAP protocol (if indicated): Yes DVT prophylaxis: Heparin GI prophylaxis: PPI Glucose control: SSI Mobility: Bed ridden Code Status: Full Family Communication:  Disposition: Remain in ICU  Labs   CBC: Recent  Labs  Lab 09/21/18 0345 09/22/18 0353 09/22/18 1504 09/23/18 0550 09/23/18 1239 09/25/18 0345 09/26/18 0419  WBC 6.6 6.2 9.6 7.2  --  7.0 6.6  NEUTROABS 4.4  --   --  5.0  --   --   --   HGB 12.9* 12.7* 12.8* 12.3* 13.6 11.5* 11.4*  HCT 43.9 44.1 43.6 42.9 40.0 39.4 38.5*  MCV 96.9 96.3 96.5 97.9  --  97.0 97.0  PLT 228 241 254 250  --  274 301  3  Basic Metabolic Panel: Recent Labs  Lab 09/22/18 1504 09/23/18 0550  09/23/18 1239  09/24/18 0343 09/24/18 1459 09/24/18 2238 09/25/18 0345 09/25/18 1640 09/26/18 0419  NA 151* 151*  --  151*  --   --  151*  --  150*  --  148*  K 3.4* 3.7  --  3.7  --   --  3.2*  --  3.7  --  3.5  CL 108 112*  --   --   --   --  114*  --  113*  --  112*  CO2 30 27  --   --   --   --  27  --  26  --  24  GLUCOSE 124* 133*  --   --   --   --  104*   --  143*  --  148*  BUN 58* 62*  --   --   --   --  62*  --  57*  --  52*  CREATININE 2.90* 2.79*  --   --   --   --  3.41*  --  3.06*  --  2.43*  CALCIUM 8.3* 8.5*  --   --   --   --  8.2*  --  8.2*  --  8.3*  MG 2.3 2.2   < >  --    < > 2.2 2.3 2.3 2.3 2.3  --   PHOS  --   --    < >  --    < > 5.5* 5.3* 4.8* 4.4 3.5  --    < > = values in this interval not displayed.   GFR: Estimated Creatinine Clearance: 53.6 mL/min (A) (by C-G formula based on SCr of 2.43 mg/dL (H)). Recent Labs  Lab 09/22/18 1504 09/23/18 0550 09/25/18 0345 09/26/18 0419  WBC 9.6 7.2 7.0 6.6  LATICACIDVEN 0.9  --   --   --     Liver Function Tests: Recent Labs  Lab 09/22/18 1504 09/26/18 0419  AST 74* 34  ALT 68* 44  ALKPHOS 113 81  BILITOT 1.3* 1.4*  PROT 6.6 6.2*  ALBUMIN 2.5* 2.2*   No results for input(s): LIPASE, AMYLASE in the last 168 hours. No results for input(s): AMMONIA in the last 168 hours.  ABG    Component Value Date/Time   PHART 7.312 (L) 09/25/2018 0615   PCO2ART 57.2 (H) 09/25/2018 0615   PO2ART 63.2 (L) 09/25/2018 0615   HCO3 28.1 (H) 09/25/2018 0615   TCO2 34 (H) 09/23/2018 1239   O2SAT 88.5 09/25/2018 0615     Coagulation Profile: No results for input(s): INR, PROTIME in the last 168 hours.  Cardiac Enzymes: Recent Labs  Lab 09/22/18 0404 09/22/18 1648  TROPONINI 0.03* 0.21*    HbA1C: No results found for: HGBA1C  CBG: Recent Labs  Lab 09/25/18 1217 09/25/18 1622 09/25/18 1945 09/25/18 2344 09/26/18 0355  GLUCAP 113* 118* 119* 146* 140*  Review of Systems:   Currently intubated and sedated  Past Medical History  He,  has a past medical history of Diabetes mellitus without complication (HCC), Hypertension, and Sleep apnea.   Surgical History    Past Surgical History:  Procedure Laterality Date  . APPENDECTOMY  1979  . ORIF ANKLE FRACTURE Left 2018-10-09   Procedure: Left fibular and tibial open treatment with internal fixation;  Surgeon:  Toni Arthurs, MD;  Location: [facility];  Service: Orthopedics;  Laterality: Left;   Outpatient in bed     Social History   reports that he quit smoking about 2 months ago. His smoking use included cigarettes. He has never used smokeless tobacco. He reports previous alcohol use. He reports that he does not use drugs.   Family History   His family history is not on file.   Allergies No Known Allergies   Home Medications  Prior to Admission medications   Medication Sig Start Date End Date Taking? Authorizing Provider  glipiZIDE (GLUCOTROL) 5 MG tablet Take by mouth 2 (two) times daily before a meal.   Yes [provider]  lisinopril (PRINIVIL,ZESTRIL) 10 MG tablet Take 10 mg by mouth daily.   Yes [provider]  metFORMIN (GLUCOPHAGE) 500 MG tablet Take by mouth 2 (two) times daily with a meal.   Yes [provider]  aspirin EC 81 MG tablet Take 1 tablet (81 mg total) by mouth 2 (two) times daily. 2018-10-09   Jacinta Shoe, PA-C  docusate sodium (COLACE) 100 MG capsule Take 1 capsule (100 mg total) by mouth 2 (two) times daily. While taking narcotic pain medicine. 10-09-18   Jacinta Shoe, PA-C  oxyCODONE (ROXICODONE) 5 MG immediate release tablet Take 1 tablet (5 mg total) by mouth every 4 (four) hours as needed for up to 5 days for severe pain. 09-Oct-2018 09/20/18  Jacinta Shoe, PA-C  senna (SENOKOT) 8.6 MG TABS tablet Take 2 tablets (17.2 mg total) by mouth 2 (two) times daily. Oct 09, 2018   Jacinta Shoe, PA-C     Critical care time:     Claudean Severance, M.D. PGY1 Pager [phone] 09/26/2018 6:55 AM

## 2018-09-26 NOTE — Progress Notes (Signed)
Subjective: 11 Days Post-Op Procedure(s) (LRB): Left fibular and tibial open treatment with internal fixation (Left)  Patient sedated on mechanical vent.   Objective:   VITALS:  Temp:  [98.5 F (36.9 C)-101.1 F (38.4 C)] 98.5 F (36.9 C) (01/27 0357) Pulse Rate:  [73-107] 81 (01/27 0608) Resp:  [18-29] 20 (01/27 0608) BP: (100-135)/(59-81) 106/70 (01/27 0608) SpO2:  [83 %-98 %] 94 % (01/27 7473) Arterial Line BP: (93-138)/(47-70) 121/64 (01/27 0608) FiO2 (%):  [60 %-100 %] 70 % (01/27 0608) Weight:  [152.7 kg] 152.7 kg (01/27 0500)  General: WDWN patient in NAD. Chest:  Even non-labored respirations on mechanical vent Skin:  SLS C/D/I, no rashes or lesions Extremities: warm/dry, no visible edema, erythema or echymosis.  No lymphadenopathy. Pulses: Popliteus 2+ MSK:  Unable to assess    LABS Recent Labs    09/23/18 1239 09/25/18 0345 09/26/18 0419  HGB 13.6 11.5* 11.4*  WBC  --  7.0 6.6  PLT  --  274 301   Recent Labs    09/25/18 0345 09/26/18 0419  NA 150* 148*  K 3.7 3.5  CL 113* 112*  CO2 26 24  BUN 57* 52*  CREATININE 3.06* 2.43*  GLUCOSE 143* 148*   No results for input(s): LABPT, INR in the last 72 hours.   Assessment/Plan: 11 Days Post-Op Procedure(s) (LRB): Left fibular and tibial open treatment with internal fixation (Left)  CCM notes reviewed NWB L LE   Alfredo Martinez PA-C [facility] Office:  [phone]

## 2018-09-26 NOTE — Plan of Care (Signed)
  Problem: Education: Goal: Knowledge of General Education information will improve Description Including pain rating scale, medication(s)/side effects and non-pharmacologic comfort measures Outcome: Not Progressing   Problem: Health Behavior/Discharge Planning: Goal: Ability to manage health-related needs will improve Outcome: Not Progressing   Problem: Clinical Measurements: Goal: Ability to maintain clinical measurements within normal limits will improve Outcome: Not Progressing Goal: Will remain free from infection Outcome: Not Progressing Goal: Diagnostic test results will improve Outcome: Not Progressing Goal: Respiratory complications will improve Outcome: Not Progressing Goal: Cardiovascular complication will be avoided Outcome: Not Progressing   Problem: Activity: Goal: Risk for activity intolerance will decrease Outcome: Not Progressing   Problem: Nutrition: Goal: Adequate nutrition will be maintained Outcome: Not Progressing   Problem: Coping: Goal: Level of anxiety will decrease Outcome: Not Progressing   Problem: Elimination: Goal: Will not experience complications related to bowel motility Outcome: Not Progressing Goal: Will not experience complications related to urinary retention Outcome: Not Progressing   Problem: Pain Managment: Goal: General experience of comfort will improve Outcome: Not Progressing   Problem: Safety: Goal: Ability to remain free from injury will improve Outcome: Not Progressing   Problem: Skin Integrity: Goal: Risk for impaired skin integrity will decrease Outcome: Not Progressing   Problem: Activity: Goal: Ability to tolerate increased activity will improve Outcome: Not Progressing   Problem: Respiratory: Goal: Ability to maintain a clear airway and adequate ventilation will improve Outcome: Not Progressing   Problem: Role Relationship: Goal: Method of communication will improve Outcome: Not Progressing  Requires  80% FiO2, 12 of PEEP. Progress still limited by agitation.

## 2018-09-27 ENCOUNTER — Inpatient Hospital Stay (HOSPITAL_COMMUNITY): Payer: No Typology Code available for payment source

## 2018-09-27 LAB — GLUCOSE, CAPILLARY
Glucose-Capillary: 112 mg/dL — ABNORMAL HIGH (ref 70–99)
Glucose-Capillary: 117 mg/dL — ABNORMAL HIGH (ref 70–99)
Glucose-Capillary: 127 mg/dL — ABNORMAL HIGH (ref 70–99)
Glucose-Capillary: 142 mg/dL — ABNORMAL HIGH (ref 70–99)
Glucose-Capillary: 148 mg/dL — ABNORMAL HIGH (ref 70–99)
Glucose-Capillary: 179 mg/dL — ABNORMAL HIGH (ref 70–99)

## 2018-09-27 LAB — BASIC METABOLIC PANEL
Anion gap: 11 (ref 5–15)
BUN: 57 mg/dL — ABNORMAL HIGH (ref 6–20)
CALCIUM: 8.2 mg/dL — AB (ref 8.9–10.3)
CO2: 25 mmol/L (ref 22–32)
Chloride: 114 mmol/L — ABNORMAL HIGH (ref 98–111)
Creatinine, Ser: 2.32 mg/dL — ABNORMAL HIGH (ref 0.61–1.24)
GFR calc Af Amer: 36 mL/min — ABNORMAL LOW (ref >60)
GFR calc non Af Amer: 31 mL/min — ABNORMAL LOW (ref >60)
Glucose, Bld: 136 mg/dL — ABNORMAL HIGH (ref 70–99)
Potassium: 3.4 mmol/L — ABNORMAL LOW (ref 3.5–5.1)
Sodium: 150 mmol/L — ABNORMAL HIGH (ref 135–145)

## 2018-09-27 LAB — BLOOD GAS, ARTERIAL
Acid-Base Excess: 0.9 mmol/L (ref 0.0–2.0)
Bicarbonate: 25 mmol/L (ref 20.0–28.0)
Drawn by: 51155
FIO2: 80
O2 SAT: 90.1 %
PEEP: 5 cmH2O
Patient temperature: 100.4
RATE: 24 resp/min
pCO2 arterial: 42.8 mmHg (ref 32.0–48.0)
pH, Arterial: 7.391 (ref 7.350–7.450)
pO2, Arterial: 66.9 mmHg — ABNORMAL LOW (ref 83.0–108.0)

## 2018-09-27 MED ORDER — PRO-STAT SUGAR FREE PO LIQD
60.0000 mL | Freq: Every day | ORAL | Status: DC
  Administered 2018-09-27 – 2018-09-29 (×8): 60 mL
  Filled 2018-09-27 (×8): qty 60

## 2018-09-27 MED ORDER — MAGNESIUM CITRATE PO SOLN
1.0000 | Freq: Once | ORAL | Status: AC
  Administered 2018-09-27: 1
  Filled 2018-09-27: qty 296

## 2018-09-27 MED ORDER — FREE WATER
200.0000 mL | Status: DC
  Administered 2018-09-27 – 2018-09-30 (×13): 200 mL

## 2018-09-27 MED ORDER — POTASSIUM CHLORIDE 10 MEQ/50ML IV SOLN
10.0000 meq | INTRAVENOUS | Status: AC
  Administered 2018-09-27 (×5): 10 meq via INTRAVENOUS
  Filled 2018-09-27 (×4): qty 50

## 2018-09-27 NOTE — Progress Notes (Signed)
eLink Physician-Brief Progress Note Patient Name: Rodney Hoffman DOB: 09/09/1965 MRN: [id number]   Date of Service  09/27/2018  HPI/Events of Note  K+ = 3.4 and Creatinine = 2.32.   eICU Interventions  Will not replace K+ d/t renal dysfunction and slow resolution.      Intervention Category Major Interventions: Electrolyte abnormality - evaluation and management  [name] 09/27/2018, 6:25 AM

## 2018-09-27 NOTE — Progress Notes (Signed)
Subjective: 12 Days Post-Op Procedure(s) (LRB): Left fibular and tibial open treatment with internal fixation (Left) Patient is still intubated and sedated.  Notes reviewed.    Objective: Vital signs in last 24 hours: Temp:  [99.2 F (37.3 C)-102.6 F (39.2 C)] 101.6 F (38.7 C) (01/28 2002) Pulse Rate:  [68-119] 86 (01/28 2200) Resp:  [21-24] 24 (01/28 2200) BP: (106-121)/(54-80) 109/54 (01/28 2200) SpO2:  [91 %-100 %] 92 % (01/28 2200) Arterial Line BP: (89-128)/(51-90) 89/78 (01/28 2200) FiO2 (%):  [70 %-100 %] 90 % (01/28 2120) Weight:  [153.8 kg] 153.8 kg (01/28 0500)  Intake/Output from previous day: 01/27 0701 - 01/28 0700 In: 3874.8 [I.V.:1874.7; NG/GT:1300; IV Piggyback:700.2] Out: 1385 [Urine:1385] Intake/Output this shift: Total I/O In: 514.8 [I.V.:354.8; NG/GT:60; IV Piggyback:100] Out: 225 [Urine:225]  Recent Labs    09/25/18 0345 09/26/18 0419 09/26/18 1458  HGB 11.5* 11.4* 10.9*   Recent Labs    09/25/18 0345 09/26/18 0419 09/26/18 1458  WBC 7.0 6.6  --   RBC 4.06* 3.97*  --   HCT 39.4 38.5* 32.0*  PLT 274 301  --    Recent Labs    09/26/18 0419 09/26/18 1458 09/27/18 0341  NA 148* 151* 150*  K 3.5 3.5 3.4*  CL 112*  --  114*  CO2 24  --  25  BUN 52*  --  57*  CREATININE 2.43*  --  2.32*  GLUCOSE 148*  --  136*  CALCIUM 8.3*  --  8.2*     Assessment/Plan: 12 Days Post-Op Procedure(s) (LRB): Left fibular and tibial open treatment with internal fixation (Left) Continue NWB on L LE.  Maintain splint.  We will continue to follow remotely.  Please contact Dr. Victorino Dike at [phone] if there are any questions.    Toni Arthurs 09/27/2018, 10:21 PM

## 2018-09-27 NOTE — Progress Notes (Signed)
Patient transported to CT and back to 3M08 without any apparent complications. °

## 2018-09-27 NOTE — Progress Notes (Signed)
Nutrition Follow-up  DOCUMENTATION CODES:   Morbid obesity  INTERVENTION:   Tube Feeding:  Vital High Protein at 20 ml/hr Pro-Stat 60 mL 5 times daily Provides 1480 kcals, 192 g of protein and 403 mL of free water  TF regimen and propofol at current rate providing 2483 total kcal/day. Exceeds calorie needs   NUTRITION DIAGNOSIS:   Inadequate oral intake related to inability to eat as evidenced by NPO status.  Being addressed via TF   GOAL:   Provide needs based on ASPEN/SCCM guidelines  Progressing  MONITOR:   TF tolerance, Vent status, Labs, Weight trends, Skin  REASON FOR ASSESSMENT:   Ventilator    ASSESSMENT:   53 y.o. male admitted with PMH of smoking and DM. 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 He has stopped smoking, and his A1C is below 7. He presented to [facility] for operative treatment of painful L ankle injury.  1/25 Cortrak placed  Patient is currently intubated on ventilator support MV: 12.7 L/min Temp (24hrs), Avg:100.4 F (38 C), Min:99.2 F (37.3 C), Max:102.5 F (39.2 C)  Propofol: 38 ml/hr (1003 kcals/day)  Vital High Protein @ 50 ml/hr Free water flush at 200 mL q 4 hours  Labs: sodium 150 (H), potassium 3.4 (L), Creatinine 2.32, BUN 57 Meds: reviewed  Diet Order:   Diet Order            Diet - low sodium heart healthy              EDUCATION NEEDS:   Not appropriate for education at this time  Skin:  Skin Assessment: Skin Integrity Issues: Skin Integrity Issues:: Incisions Incisions: L ankle (09/07/2018)  Last BM:  1/25  Height:   Ht Readings from Last 1 Encounters:  09/02/2018 6\' 1"  (1.854 m)    Weight:   Wt Readings from Last 1 Encounters:  09/27/18 (!) 153.8 kg    Ideal Body Weight:  83.64 kg  BMI:  Body mass index is 44.73 kg/m.  Estimated Nutritional Needs:   Kcal:  0347-4259  kcal  Protein:  167-209 grams   Fluid:  >/= 1.5 L/day   Romelle Starcher MS, RD, LDN, CNSC [phone] Pager  [phone] Weekend/On-Call Pager

## 2018-09-27 NOTE — Progress Notes (Signed)
NAME:  Rodney Hoffman, MRN:  [id number], DOB:  12-04-1965, LOS: 12 ADMISSION DATE:  09/10/2018, CONSULTATION DATE:  1/16 REFERRING MD:  Dr. Victorino Dike, CHIEF COMPLAINT:  Post op respiratory failure  Brief History   This is a 53 year old male with a history of HTN, OSA and DM who after getting a ORIF of his left fibular and tibial developed hypoxic and hypercarbic respiratory failure requiring intubation and transfer to [facility] and the ICU for airway management.  History of present illness   53 year old male with past medical history as below, which is significant for tobacco abuse and diabetes.  The patient is encephalopathic and therefore history is obtained largely from chart review.  He suffered a left ankle fracture about a year ago and after some lifestyle modification and better management of his diabetes he presented on 1/16 to the outpatient surgery center for left ankle ORIF under Dr. Victorino Dike.  He was extubated immediately postoperatively, however, he then developed hypoxemia with sats down into the low 80s and altered mental status progressing to obtundation.  He was transferred to [facility] and [facility] for airway management.  He was given 120 mg IV lasix and had poor output, he became profoundly hypotensive. Bedside u/s showed dilated RV and hyperdynamic LV. Cardiology called and patient was started on levophed drip. He initially had respiratory acidosis and was started on bicarbonate drip, follow up ABG showed respiratory alkalosis so bicarbonate drip was stopped.    Past Medical History   Past Medical History:  Diagnosis Date  . Diabetes mellitus without complication (HCC)   . Hypertension   . Sleep apnea    does not wear CPAP     Significant Hospital Events   1/16 ORIF L ankle. Failed extubation. Transfer to [facility] 1/23 > Failed extubation, [facility] called for respiratory distress and pulseless V tach and reintubated  Consults:    Procedures:  1/16 L ankle ORIF (Dr.  Victorino Dike) 1/16 ETT  Significant Diagnostic Tests:   Echocardiogram 09/16/18 Impressions:  - Normal LV size with moderate LV hypertrophy. EF 50-55%, low   normal to mildly decreased. Basal inferior akinesis. Mildly   dilated RV with mild to moderately decreased systolic function.   Dilated IVC suggestive of elevated RV filling pressure.  CT head 09/21/18   IMPRESSION: No acute intracranial normality.  Pansinus disease and bilateral mastoid effusions. Micro Data:   Blood cultures 1/18 > strep viridans in 1/4 bottles, possible contaminant Blood cultures 1/22 > NGTD  Antimicrobials:      Interim history/subjective:  Overnight patient was noted to have hypokalemia to 3.4 and Cr 2.32, no replacement given due to kidney disease. Patient did have a fever up to 102.5 last night. He has been weaned off pressors however had to be restarted on them later in the morning. Patient is currently intubated, no family at bedside.   Objective   Blood pressure 108/79, pulse 80, temperature 100.2 F (37.9 C), temperature source Oral, resp. rate (!) 24, height 6\' 1"  (1.854 m), weight (!) 153.8 kg, SpO2 100 %. CVP:  [8 mmHg] 8 mmHg  Vent Mode: PRVC FiO2 (%):  [80 %] 80 % Set Rate:  [20 bmp-24 bmp] 24 bmp Vt Set:  [550 mL] 550 mL PEEP:  [12 cmH20] 12 cmH20 Plateau Pressure:  [0 cmH20-33 cmH20] 28 cmH20   Intake/Output Summary (Last 24 hours) at 09/27/2018 0646 Last data filed at 09/27/2018 0600 Gross per 24 hour  Intake 3924.85 ml  Output 1335 ml  Net 2589.85 ml   Filed Weights   09/25/18 0500 09/26/18 0500 09/27/18 0500  Weight: (!) 149.3 kg (!) 152.7 kg (!) 153.8 kg    Examination: General: Intubated, left IJ central line in place, right femoral line in place, obese male, sedated, not following commands  HENT: Normocephalic, atraumatic Lungs: Bibasilar crackles, on ventilator Cardiovascular: RRR, no m/r/g Abdomen: soft, no guarding Extremities: Left leg in splint, trace edema Neuro:  Sedated GU: Foley in place  Resolved Hospital Problem list     Assessment & Plan:   Acute hypoxic respiratory failure:  Currently intubated: Respiratory acidosis: Patient is still been having persistent fevers the past few days, his last fever was up to 102.5 last night.  He is also intermittently requiring pressors for hypotension.  Patient was switched to vancomycin and Zosyn yesterday and we repeated blood and respiratory cultures. He was noted to have bilateral pleural effusions on CXR yesterday, obtaining a CT chest to evaluate. His CVP today was 20, has had a net input of +2500 today and he has bilateral rhonchi on exam. He will likely need to have diuresis today.  -Continue Vanco and Zosyn -F/u blood cultures and respiratory cultures -Pulmonary hygiene  -Continue versed and propofol drip and dilaudid drip -Continue Seroquel 100 mg twice daily -Continue Klonopin 1 mg q6 hr -Attempt to wean IV sedative -Continue hydralazine 5 mg q4hr PRN for hypertension -Monitor CVP -Plan for tracheostomy at some point if ventilator setting can come down -F/u CT chest, may need to add rocuranium for patient to be able to tolerate procedure -Lasix 40 mg IV today, strict I+Os, daily weight  Shock: Most likely multifactorial in the setting of attempted diuresis, grade 1 diastolic dysfunction, dilated right ventricle, elevated right ventricle pressures made worse by propofol drip. Patient is still requiring a low-dose of Levophed however this is likely due to his high amount of sedative medications -Cortisol level > 134 -Blood Cultures > strep viridans growing in 1/4 bottles -Repeat blood cultures > no growth to date  AKI: -Creatinine improving, will continue to monitor. Patients CVP was elevated to around 20 this morning, he has a net input +2500. We will need to slowly diuresis him today and we will monitor his kidney function.  -Daily BMP  Hypernatremia: -Na up to 150 today -Increased free  water to 200 q8 hours yesterday, continue this -Repeat BMP in AM  Left ankle injury -S/p ORIF on 1/16 by Dr. Victorino Dike -Per ortho >> reinforce splint as needed, will need PT/OT as he becomes stable. No evidence of lymphedema or streaking up left leg.   DM: -SSI -Frequent CBGs   HTN: -Patient is on lisinopril at home, BP today was on the softer side and he had required a levoped drip.  -Hold Lisinopril  -Can restart as necessary   Best practice:  Diet: Tube feeds Pain/Anxiety/Delirium protocol (if indicated): Versed and dilaudid for RASS -1 to -2 VAP protocol (if indicated): Yes DVT prophylaxis: Heparin GI prophylaxis: PPI Glucose control: SSI Mobility: Bed ridden Code Status: Full Family Communication:  Disposition: Remain in ICU  Labs   CBC: Recent Labs  Lab 09/21/18 0345 09/22/18 0353 09/22/18 1504 09/23/18 0550 09/23/18 1239 09/25/18 0345 09/26/18 0419 09/26/18 1458  WBC 6.6 6.2 9.6 7.2  --  7.0 6.6  --   NEUTROABS 4.4  --   --  5.0  --   --   --   --   HGB 12.9* 12.7* 12.8* 12.3* 13.6 11.5* 11.4* 10.9*  HCT 43.9 44.1 43.6 42.9 40.0 39.4 38.5* 32.0*  MCV 96.9 96.3 96.5 97.9  --  97.0 97.0  --   PLT 228 241 254 250  --  274 301  --   3  Basic Metabolic Panel: Recent Labs  Lab 09/23/18 0550  09/24/18 0343 09/24/18 1459 09/24/18 2238 09/25/18 0345 09/25/18 1640 09/26/18 0419 09/26/18 1458 09/27/18 0341  NA 151*   < >  --  151*  --  150*  --  148* 151* 150*  K 3.7   < >  --  3.2*  --  3.7  --  3.5 3.5 3.4*  CL 112*  --   --  114*  --  113*  --  112*  --  114*  CO2 27  --   --  27  --  26  --  24  --  25  GLUCOSE 133*  --   --  104*  --  143*  --  148*  --  136*  BUN 62*  --   --  62*  --  57*  --  52*  --  57*  CREATININE 2.79*  --   --  3.41*  --  3.06*  --  2.43*  --  2.32*  CALCIUM 8.5*  --   --  8.2*  --  8.2*  --  8.3*  --  8.2*  MG 2.2   < > 2.2 2.3 2.3 2.3 2.3  --   --   --   PHOS  --    < > 5.5* 5.3* 4.8* 4.4 3.5  --   --   --    < > = values  in this interval not displayed.   GFR: Estimated Creatinine Clearance: 56.4 mL/min (A) (by C-G formula based on SCr of 2.32 mg/dL (H)). Recent Labs  Lab 09/22/18 1504 09/23/18 0550 09/25/18 0345 09/26/18 0419  WBC 9.6 7.2 7.0 6.6  LATICACIDVEN 0.9  --   --   --     Liver Function Tests: Recent Labs  Lab 09/22/18 1504 09/26/18 0419  AST 74* 34  ALT 68* 44  ALKPHOS 113 81  BILITOT 1.3* 1.4*  PROT 6.6 6.2*  ALBUMIN 2.5* 2.2*   No results for input(s): LIPASE, AMYLASE in the last 168 hours. No results for input(s): AMMONIA in the last 168 hours.  ABG    Component Value Date/Time   PHART 7.391 09/27/2018 0411   PCO2ART 42.8 09/27/2018 0411   PO2ART 66.9 (L) 09/27/2018 0411   HCO3 25.0 09/27/2018 0411   TCO2 28 09/26/2018 1458   O2SAT 90.1 09/27/2018 0411     Coagulation Profile: No results for input(s): INR, PROTIME in the last 168 hours.  Cardiac Enzymes: Recent Labs  Lab 09/22/18 0404 09/22/18 1648  TROPONINI 0.03* 0.21*    HbA1C: No results found for: HGBA1C  CBG: Recent Labs  Lab 09/26/18 1143 09/26/18 1537 09/26/18 2008 09/26/18 2350 09/27/18 0354  GLUCAP 136* 109* 105* 148* 112*    Review of Systems:   Currently intubated and sedated  Past Medical History  He,  has a past medical history of Diabetes mellitus without complication (HCC), Hypertension, and Sleep apnea.   Surgical History    Past Surgical History:  Procedure Laterality Date  . APPENDECTOMY  1979  . ORIF ANKLE FRACTURE Left 09/02/2018   Procedure: Left fibular and tibial open treatment with internal fixation;  Surgeon: Toni ArthursHewitt, John, MD;  Location: [facility];  Service:  Orthopedics;  Laterality: Left;   Outpatient in bed     Social History   reports that he quit smoking about 2 months ago. His smoking use included cigarettes. He has never used smokeless tobacco. He reports previous alcohol use. He reports that he does not use drugs.   Family History    His family history is not on file.   Allergies No Known Allergies   Home Medications  Prior to Admission medications   Medication Sig Start Date End Date Taking? Authorizing Provider  glipiZIDE (GLUCOTROL) 5 MG tablet Take by mouth 2 (two) times daily before a meal.   Yes [provider]  lisinopril (PRINIVIL,ZESTRIL) 10 MG tablet Take 10 mg by mouth daily.   Yes [provider]  metFORMIN (GLUCOPHAGE) 500 MG tablet Take by mouth 2 (two) times daily with a meal.   Yes [provider]  aspirin EC 81 MG tablet Take 1 tablet (81 mg total) by mouth 2 (two) times daily. 09/26/2018   Jacinta Shoe, PA-C  docusate sodium (COLACE) 100 MG capsule Take 1 capsule (100 mg total) by mouth 2 (two) times daily. While taking narcotic pain medicine. 09/01/2018   Jacinta Shoe, PA-C  oxyCODONE (ROXICODONE) 5 MG immediate release tablet Take 1 tablet (5 mg total) by mouth every 4 (four) hours as needed for up to 5 days for severe pain. 09/14/2018 09/20/18  Jacinta Shoe, PA-C  senna (SENOKOT) 8.6 MG TABS tablet Take 2 tablets (17.2 mg total) by mouth 2 (two) times daily. 09/21/2018   Jacinta Shoe, PA-C     Critical care time:     Claudean Severance, M.D. PGY1 Pager [phone] 09/27/2018 6:46 AM

## 2018-09-28 DIAGNOSIS — R6521 Severe sepsis with septic shock: Secondary | ICD-10-CM

## 2018-09-28 DIAGNOSIS — A419 Sepsis, unspecified organism: Secondary | ICD-10-CM

## 2018-09-28 DIAGNOSIS — J80 Acute respiratory distress syndrome: Secondary | ICD-10-CM

## 2018-09-28 LAB — BASIC METABOLIC PANEL
Anion gap: 11 (ref 5–15)
BUN: 62 mg/dL — ABNORMAL HIGH (ref 6–20)
CO2: 21 mmol/L — AB (ref 22–32)
Calcium: 8 mg/dL — ABNORMAL LOW (ref 8.9–10.3)
Chloride: 115 mmol/L — ABNORMAL HIGH (ref 98–111)
Creatinine, Ser: 2.37 mg/dL — ABNORMAL HIGH (ref 0.61–1.24)
GFR calc Af Amer: 35 mL/min — ABNORMAL LOW (ref >60)
GFR calc non Af Amer: 30 mL/min — ABNORMAL LOW (ref >60)
Glucose, Bld: 168 mg/dL — ABNORMAL HIGH (ref 70–99)
Potassium: 3.8 mmol/L (ref 3.5–5.1)
Sodium: 147 mmol/L — ABNORMAL HIGH (ref 135–145)

## 2018-09-28 LAB — CULTURE, ROUTINE-SINUS

## 2018-09-28 LAB — GLUCOSE, CAPILLARY
Glucose-Capillary: 131 mg/dL — ABNORMAL HIGH (ref 70–99)
Glucose-Capillary: 150 mg/dL — ABNORMAL HIGH (ref 70–99)
Glucose-Capillary: 155 mg/dL — ABNORMAL HIGH (ref 70–99)
Glucose-Capillary: 156 mg/dL — ABNORMAL HIGH (ref 70–99)
Glucose-Capillary: 158 mg/dL — ABNORMAL HIGH (ref 70–99)
Glucose-Capillary: 160 mg/dL — ABNORMAL HIGH (ref 70–99)

## 2018-09-28 LAB — CBC
HCT: 42.3 % (ref 39.0–52.0)
Hemoglobin: 11.7 g/dL — ABNORMAL LOW (ref 13.0–17.0)
MCH: 27.1 pg (ref 26.0–34.0)
MCHC: 27.7 g/dL — ABNORMAL LOW (ref 30.0–36.0)
MCV: 97.9 fL (ref 80.0–100.0)
Platelets: 337 10*3/uL (ref 150–400)
RBC: 4.32 MIL/uL (ref 4.22–5.81)
RDW: 14.9 % (ref 11.5–15.5)
WBC: 9.6 10*3/uL (ref 4.0–10.5)
nRBC: 0 % (ref 0.0–0.2)

## 2018-09-28 MED ORDER — LACTULOSE 10 GM/15ML PO SOLN
20.0000 g | Freq: Every day | ORAL | Status: DC
  Administered 2018-09-28 – 2018-10-03 (×5): 20 g
  Filled 2018-09-28 (×5): qty 30

## 2018-09-28 NOTE — Progress Notes (Signed)
Pt had significant drop in O2 sats to low 60's. RT called to bedside. Pt bagged for several minutes and FiO2 on vent increased from 60% to 100% by RT after placing pt back on vent. O2 sats now 88-90%.  [facility] called and notified of event. Also notified that pts temp is 102.9. PRN Tylenol given and ice packs placed around pt.

## 2018-09-28 NOTE — Care Management Note (Signed)
Case Management Note  Patient Details  Name: Rodney Hoffman MRN: [id number] Date of Birth: 19-Apr-1966  Received call from ICU RN that she had received a call from a woman who would not provide her name but did provide a phone number, [phone]. The caller stated that the point of contact on the patient chart, Vickie, was not patient's girlfriend and had not been for several years, but that patient was renting a room from [facility] for $300/month.   Spoke with Dr. Gwyneth Revels who was aware of the call.   Spoke with workman comp RN CM, [place], [phone]. Cristal has scheduled a time to meet with Vickie tomorrow. Cristal had no additional family information. Stated that patient is no longer employed with company, but stated that she has the adjuster information-Elizabeth Dersch-[phone]. Cristal attempted to reach out to Ms. Dersch, but call went to voicemail.   Spoke with [facility] person) discussing getting any possible names for family members who could be searched.   Spoke with Dianne Dun, [phone], who stated that patient has no family. States that his adoptive parents are deceased. No children, no siblings. Stated that he is from [place], but left there about 30 years ago. Stated pt referred to a good friend as a brother; however, Larene Beach, stated that she spoke with Ruta Hinds who is the wife of the good friend-but he is not pt's actual brother.   Spoke with "anonymous" caller who identified herself as Archie Patten Going and said she is a good friend of pt. Archie Patten stated that she is concerned that Larene Beach is trying to get patient's money saying that for the last year and half since patient broke his ankle, he gets a weekly check of $658/week and is due for large settlement. Archie Patten stated that patient had been living with [facility] for about 6 months paying her rent. Archie Patten stated that she has patient's phone, but cannot access it d/t it being locked. Archie Patten provided 2 phone  numbers for point of [phone]; and [phone]. Archie Patten also stated that patient is adopted and his adopted parents are deceased. Stated he has no siblings and no children. Stated that he was originally from [place]. Archie Patten states that she is concerned that Vickie does not have patient's best interest and is only concerned about getting control of patient's affairs.   Bess Kinds, RN 09/28/2018, 4:44 PM

## 2018-09-28 NOTE — Care Management Note (Signed)
Case Management Note Hortencia Conradi, RN MSN CCM Transitions of Care [address] [place] [phone]  Patient Details  Name: Rodney Hoffman MRN: [id number] Date of Birth: Dec 19, 1965  Subjective/Objective:    Bimalleolar ankle fracture, left, closed with nonunion                Action/Plan: PTA home with girlfriend. Independent. Outpatient surgery with respiratory distress after extubation. Reintubated and transported to [facility]. Have been unable to successfully extubate. Noted patient coded 05-Oct-2022. Spoke with ICU RN and Alcide Evener RN CM [facility] [phone]) at bedside this morning. Clinical information faxed to Cristal at [phone]. Pt only point of contact is girlfriend. Will continue to follow for transition of care needs.   Expected Discharge Date:  28-Sep-2018               Expected Discharge Plan:     In-House Referral:  Clinical Social Work  Discharge planning Services  CM Consult  Post Acute Care Choice:    Choice offered to:     DME Arranged:    DME Agency:     HH Arranged:    HH Agency:     Status of Service:  In process, will continue to follow  If discussed at Long Length of Stay Meetings, dates discussed:    Additional Comments:  Bess Kinds, RN 09/28/2018, 10:45 AM

## 2018-09-28 NOTE — Progress Notes (Signed)
PEEP increased to 14 per MD order.

## 2018-09-28 NOTE — Progress Notes (Signed)
RT called to pt's room due to Sp02 dropped to the 60's.  PT bagged pt with PEEP valve in place.  Sp02 increased to 90%.  Pt place back on full support.  Fi02 increased to 100%, spo02 is 90%. wil continue to monitor.

## 2018-09-28 NOTE — Progress Notes (Signed)
Contacted Pola Corn MD about O2 sats of 86% despite RT performing lung recruitment. Order to increase PEEP up to 14. RT made aware.

## 2018-09-28 NOTE — Progress Notes (Signed)
Sp02 88%, Lung recruitment performed.  No complications throughout.  Pt placed back on previous setting.

## 2018-09-28 NOTE — Progress Notes (Signed)
NAME:  Rodney Hoffman, MRN:  [id number], DOB:  1965/11/24, LOS: 13 ADMISSION DATE:  Oct 09, 2018, CONSULTATION DATE:  1/16 REFERRING MD:  Dr. Victorino Dike, CHIEF COMPLAINT:  Post op respiratory failure  Brief History   This is a 53 year old male with a history of HTN, OSA and DM who after getting a ORIF of his left fibular and tibial developed hypoxic and hypercarbic respiratory failure requiring intubation and transfer to [facility] and the ICU for airway management.  History of present illness   53 year old male with past medical history as below, which is significant for tobacco abuse and diabetes.  The patient is encephalopathic and therefore history is obtained largely from chart review.  He suffered a left ankle fracture about a year ago and after some lifestyle modification and better management of his diabetes he presented on 1/16 to the outpatient surgery center for left ankle ORIF under Dr. Victorino Dike.  He was extubated immediately postoperatively, however, he then developed hypoxemia with sats down into the low 80s and altered mental status progressing to obtundation.  He was transferred to [facility] for airway management.  He was given 120 mg IV lasix and had poor output, he became profoundly hypotensive. Bedside u/s showed dilated RV and hyperdynamic LV. Cardiology called and patient was started on levophed drip. He initially had respiratory acidosis and was started on bicarbonate drip, follow up ABG showed respiratory alkalosis so bicarbonate drip was stopped.    Past Medical History   Past Medical History:  Diagnosis Date  . Diabetes mellitus without complication (HCC)   . Hypertension   . Sleep apnea    does not wear CPAP     Significant Hospital Events   1/16 ORIF L ankle. Failed extubation. Transfer to [facility] 1/23 > Failed extubation, CODE BLUE called for respiratory distress and pulseless V tach and reintubated  Consults:    Procedures:  1/16 L ankle ORIF (Dr.  Victorino Dike) 1/16 ETT 1/23 ETT  Significant Diagnostic Tests:   Echocardiogram 09/16/18 Impressions:  - Normal LV size with moderate LV hypertrophy. EF 50-55%, low   normal to mildly decreased. Basal inferior akinesis. Mildly   dilated RV with mild to moderately decreased systolic function.   Dilated IVC suggestive of elevated RV filling pressure.  CT head 09/21/18   IMPRESSION: No acute intracranial normality.  Pansinus disease and bilateral mastoid effusions.  CT Chest 09/27/18 IMPRESSION: 1. Nonspecific diffuse bilateral consolidation, greatest in the UPPER lungs. This may represent edema and/or infection. 2. Small bilateral pleural effusions and moderate bilateral LOWER lung consolidation/atelectasis. 3. Cardiomegaly.  Micro Data:   Blood cultures 1/18 > strep viridans in 1/4 bottles, possible contaminant Blood cultures 1/22 > NGTD Blood cultures 1/27> no growth to date Respiratory culture 1/27> rare staph aureus Antimicrobials:      Interim history/subjective:  No acute events overnight, patient was able to get his CT scan.  He is currently on vent settings at FiO2 88, PEEP 12, RR 24, tidal volume 550.  He did have a fever of 101.6 overnight.  The nurse has been trying to wean down his propofol but is been unsuccessful.  Objective   Blood pressure 132/65, pulse 79, temperature 100.1 F (37.8 C), temperature source Axillary, resp. rate (!) 24, height 6\' 1"  (1.854 m), weight (!) 154.5 kg, SpO2 92 %. CVP:  [20 mmHg] 20 mmHg  Vent Mode: PRVC FiO2 (%):  [70 %-100 %] 80 % Set Rate:  [24 bmp] 24 bmp Vt Set:  [  550 mL] 550 mL PEEP:  [12 cmH20] 12 cmH20 Plateau Pressure:  [32 cmH20-36 cmH20] 34 cmH20   Intake/Output Summary (Last 24 hours) at 09/28/2018 0646 Last data filed at 09/28/2018 0600 Gross per 24 hour  Intake 4217.2 ml  Output 1840 ml  Net 2377.2 ml   Filed Weights   09/26/18 0500 09/27/18 0500 09/28/18 0410  Weight: (!) 152.7 kg (!) 153.8 kg (!) 154.5 kg     Examination: General: Intubated, left IJ central line in place, right femoral line in place, obese male, sedated, not following commands  HENT: Normocephalic, atraumatic Lungs: Diffuse rhonchi, on ventilator Cardiovascular: RRR, no m/r/g Abdomen: soft, no guarding Extremities: Left leg in splint, trace edema Neuro: Sedated, not following commands GU: Foley in place  Resolved Hospital Problem list     Assessment & Plan:   Acute hypoxic respiratory failure:  Currently intubated: Respiratory acidosis: Patient still having persistent fevers, 101.6.  We got a chest x-ray that showed bilateral effusions.  CT chest showed use bilateral consolidation that is greatest in the upper lung, small bilateral pleural effusions, moderate bilateral lower lung consolidation/atelectasis.  Blood cultures have shown no growth to date.  Respiratory cultures have few staph aureus.  Patient still requiring levophed to maintain BP.  Has been difficult to wean him off his IV sedation especially the propofol.  He still requiring high vent settings with an FiO2 of 80, PEEP of 12, RR 24, and TV 550.  Patient is also had significant net positive fluid balance, +2300 in the past 24 hours and +5000 since admission.  He needs some diuresis however since he is still requiring vasopressors this is difficult to do.  Asked the nurse to try to wean off propofol.  -Continue Vanco and cefepime -F/u blood cultures and respiratory cultures -Pulmonary hygiene  -Continue versed and propofol drip and dilaudid drip -Continue Seroquel 100 mg twice daily -Continue Klonopin 1 mg q6 hr -Attempt to wean IV sedative -Continue hydralazine 5 mg q4hr PRN for hypertension -Monitor CVP -Plan for tracheostomy at some point if ventilator setting can come down -May need thoracentesis today  Shock: Most likely multifactorial in the setting of attempted diuresis, grade 1 diastolic dysfunction, dilated right ventricle, elevated right  ventricle pressures made worse by propofol drip. Patient is still requiring a low-dose of Levophed however this is likely due to his high amount of sedative medications -Cortisol level > 134 -Blood Cultures > strep viridans growing in 1/4 bottles -Repeat blood cultures > no growth to date  AKI: -Creatinine improving, will continue to monitor.   -Daily BMP  Hypernatremia: -Small improvement with sodium of 147 today. -Increased free water to 200 q4 hours yesterday, increased to 400  -Repeat BMP in AM  Left ankle injury -S/p ORIF on 1/16 by Dr. Victorino DikeHewitt -Per ortho >> reinforce splint as needed, will need PT/OT as he becomes stable. No evidence of lymphedema or streaking up left leg.   DM: -SSI -Frequent CBGs   HTN: -Patient is on lisinopril at home, BP today was on the softer side and he had required a levoped drip.  -Hold Lisinopril  -Can restart as necessary   Constipation: -Patient has not had a bowel movement for bowel movement.  He had mag citrate added yesterday and is still not having any bowel movement.  He also have colace and senokot.  We will likely need to add a also diet. -Enema versus lactulose  Best practice:  Diet: Tube feeds Pain/Anxiety/Delirium protocol (if indicated): Versed and  dilaudid for RASS -1 to -2 VAP protocol (if indicated): Yes DVT prophylaxis: Heparin GI prophylaxis: PPI Glucose control: SSI Mobility: Bed ridden Code Status: Full Family Communication: Spoke with girlfriend yesterday Disposition: Remain in ICU  Labs   CBC: Recent Labs  Lab 09/22/18 1504 09/23/18 0550 09/23/18 1239 09/25/18 0345 09/26/18 0419 09/26/18 1458 09/28/18 0331  WBC 9.6 7.2  --  7.0 6.6  --  9.6  NEUTROABS  --  5.0  --   --   --   --   --   HGB 12.8* 12.3* 13.6 11.5* 11.4* 10.9* 11.7*  HCT 43.6 42.9 40.0 39.4 38.5* 32.0* 42.3  MCV 96.5 97.9  --  97.0 97.0  --  97.9  PLT 254 250  --  274 301  --  337  3  Basic Metabolic Panel: Recent Labs  Lab  09/24/18 0343 09/24/18 1459 09/24/18 2238 09/25/18 0345 09/25/18 1640 09/26/18 0419 09/26/18 1458 09/27/18 0341 09/28/18 0331  NA  --  151*  --  150*  --  148* 151* 150* 147*  K  --  3.2*  --  3.7  --  3.5 3.5 3.4* 3.8  CL  --  114*  --  113*  --  112*  --  114* 115*  CO2  --  27  --  26  --  24  --  25 21*  GLUCOSE  --  104*  --  143*  --  148*  --  136* 168*  BUN  --  62*  --  57*  --  52*  --  57* 62*  CREATININE  --  3.41*  --  3.06*  --  2.43*  --  2.32* 2.37*  CALCIUM  --  8.2*  --  8.2*  --  8.3*  --  8.2* 8.0*  MG 2.2 2.3 2.3 2.3 2.3  --   --   --   --   PHOS 5.5* 5.3* 4.8* 4.4 3.5  --   --   --   --    GFR: Estimated Creatinine Clearance: 55.3 mL/min (A) (by C-G formula based on SCr of 2.37 mg/dL (H)). Recent Labs  Lab 09/22/18 1504 09/23/18 0550 09/25/18 0345 09/26/18 0419 09/28/18 0331  WBC 9.6 7.2 7.0 6.6 9.6  LATICACIDVEN 0.9  --   --   --   --     Liver Function Tests: Recent Labs  Lab 09/22/18 1504 09/26/18 0419  AST 74* 34  ALT 68* 44  ALKPHOS 113 81  BILITOT 1.3* 1.4*  PROT 6.6 6.2*  ALBUMIN 2.5* 2.2*   No results for input(s): LIPASE, AMYLASE in the last 168 hours. No results for input(s): AMMONIA in the last 168 hours.  ABG    Component Value Date/Time   PHART 7.391 09/27/2018 0411   PCO2ART 42.8 09/27/2018 0411   PO2ART 66.9 (L) 09/27/2018 0411   HCO3 25.0 09/27/2018 0411   TCO2 28 09/26/2018 1458   O2SAT 90.1 09/27/2018 0411     Coagulation Profile: No results for input(s): INR, PROTIME in the last 168 hours.  Cardiac Enzymes: Recent Labs  Lab 09/22/18 0404 09/22/18 1648  TROPONINI 0.03* 0.21*    HbA1C: No results found for: HGBA1C  CBG: Recent Labs  Lab 09/27/18 1154 09/27/18 1630 09/27/18 1941 09/27/18 2350 09/28/18 0330  GLUCAP 117* 179* 142* 148* 155*    Review of Systems:   Currently intubated and sedated  Past Medical History  He,  has a past medical  history of Diabetes mellitus without complication  (HCC), Hypertension, and Sleep apnea.   Surgical History    Past Surgical History:  Procedure Laterality Date  . APPENDECTOMY  1979  . ORIF ANKLE FRACTURE Left 09/22/2018   Procedure: Left fibular and tibial open treatment with internal fixation;  Surgeon: Toni Arthurs, MD;  Location: [facility];  Service: Orthopedics;  Laterality: Left;   Outpatient in bed     Social History   reports that he quit smoking about 2 months ago. His smoking use included cigarettes. He has never used smokeless tobacco. He reports previous alcohol use. He reports that he does not use drugs.   Family History   His family history is not on file.   Allergies No Known Allergies   Home Medications  Prior to Admission medications   Medication Sig Start Date End Date Taking? Authorizing Provider  glipiZIDE (GLUCOTROL) 5 MG tablet Take by mouth 2 (two) times daily before a meal.   Yes [provider]  lisinopril (PRINIVIL,ZESTRIL) 10 MG tablet Take 10 mg by mouth daily.   Yes [provider]  metFORMIN (GLUCOPHAGE) 500 MG tablet Take by mouth 2 (two) times daily with a meal.   Yes [provider]  aspirin EC 81 MG tablet Take 1 tablet (81 mg total) by mouth 2 (two) times daily. 09/14/2018   Jacinta Shoe, PA-C  docusate sodium (COLACE) 100 MG capsule Take 1 capsule (100 mg total) by mouth 2 (two) times daily. While taking narcotic pain medicine. 09/11/2018   Jacinta Shoe, PA-C  oxyCODONE (ROXICODONE) 5 MG immediate release tablet Take 1 tablet (5 mg total) by mouth every 4 (four) hours as needed for up to 5 days for severe pain. 09/10/2018 09/20/18  Jacinta Shoe, PA-C  senna (SENOKOT) 8.6 MG TABS tablet Take 2 tablets (17.2 mg total) by mouth 2 (two) times daily. 09/23/2018   Jacinta Shoe, PA-C     Critical care time:     Claudean Severance, M.D. PGY1 Pager [phone] 09/28/2018 6:46 AM

## 2018-09-29 DIAGNOSIS — R0902 Hypoxemia: Secondary | ICD-10-CM

## 2018-09-29 LAB — CBC
HCT: 39.5 % (ref 39.0–52.0)
Hemoglobin: 11.5 g/dL — ABNORMAL LOW (ref 13.0–17.0)
MCH: 28.8 pg (ref 26.0–34.0)
MCHC: 29.1 g/dL — ABNORMAL LOW (ref 30.0–36.0)
MCV: 98.8 fL (ref 80.0–100.0)
Platelets: 351 10*3/uL (ref 150–400)
RBC: 4 MIL/uL — AB (ref 4.22–5.81)
RDW: 15.1 % (ref 11.5–15.5)
WBC: 9.7 10*3/uL (ref 4.0–10.5)
nRBC: 0 % (ref 0.0–0.2)

## 2018-09-29 LAB — POCT I-STAT 7, (LYTES, BLD GAS, ICA,H+H)
Acid-base deficit: 2 mmol/L (ref 0.0–2.0)
Bicarbonate: 24.1 mmol/L (ref 20.0–28.0)
Calcium, Ion: 1.12 mmol/L — ABNORMAL LOW (ref 1.15–1.40)
HEMATOCRIT: 35 % — AB (ref 39.0–52.0)
HEMOGLOBIN: 11.9 g/dL — AB (ref 13.0–17.0)
O2 Saturation: 83 %
Potassium: 4.1 mmol/L (ref 3.5–5.1)
SODIUM: 149 mmol/L — AB (ref 135–145)
TCO2: 25 mmol/L (ref 22–32)
pCO2 arterial: 44.9 mmHg (ref 32.0–48.0)
pH, Arterial: 7.339 — ABNORMAL LOW (ref 7.350–7.450)
pO2, Arterial: 51 mmHg — ABNORMAL LOW (ref 83.0–108.0)

## 2018-09-29 LAB — GLUCOSE, CAPILLARY
Glucose-Capillary: 136 mg/dL — ABNORMAL HIGH (ref 70–99)
Glucose-Capillary: 140 mg/dL — ABNORMAL HIGH (ref 70–99)
Glucose-Capillary: 129 mg/dL — ABNORMAL HIGH (ref 70–99)
Glucose-Capillary: 118 mg/dL — ABNORMAL HIGH (ref 70–99)
Glucose-Capillary: 94 mg/dL (ref 70–99)

## 2018-09-29 LAB — COMPREHENSIVE METABOLIC PANEL
ALT: 37 U/L (ref 0–44)
ANION GAP: 10 (ref 5–15)
AST: 33 U/L (ref 15–41)
Albumin: 1.7 g/dL — ABNORMAL LOW (ref 3.5–5.0)
Alkaline Phosphatase: 84 U/L (ref 38–126)
BUN: 79 mg/dL — ABNORMAL HIGH (ref 6–20)
CO2: 22 mmol/L (ref 22–32)
Calcium: 7.7 mg/dL — ABNORMAL LOW (ref 8.9–10.3)
Chloride: 115 mmol/L — ABNORMAL HIGH (ref 98–111)
Creatinine, Ser: 3.02 mg/dL — ABNORMAL HIGH (ref 0.61–1.24)
GFR calc non Af Amer: 22 mL/min — ABNORMAL LOW (ref >60)
GFR, EST AFRICAN AMERICAN: 26 mL/min — AB (ref >60)
Glucose, Bld: 153 mg/dL — ABNORMAL HIGH (ref 70–99)
Potassium: 4 mmol/L (ref 3.5–5.1)
Sodium: 147 mmol/L — ABNORMAL HIGH (ref 135–145)
Total Bilirubin: 2.2 mg/dL — ABNORMAL HIGH (ref 0.3–1.2)
Total Protein: 5.6 g/dL — ABNORMAL LOW (ref 6.5–8.1)

## 2018-09-29 LAB — BASIC METABOLIC PANEL
ANION GAP: 11 (ref 5–15)
BUN: 90 mg/dL — ABNORMAL HIGH (ref 6–20)
CO2: 19 mmol/L — ABNORMAL LOW (ref 22–32)
Calcium: 7.8 mg/dL — ABNORMAL LOW (ref 8.9–10.3)
Chloride: 117 mmol/L — ABNORMAL HIGH (ref 98–111)
Creatinine, Ser: 3.74 mg/dL — ABNORMAL HIGH (ref 0.61–1.24)
GFR calc Af Amer: 20 mL/min — ABNORMAL LOW (ref >60)
GFR calc non Af Amer: 17 mL/min — ABNORMAL LOW (ref >60)
Glucose, Bld: 121 mg/dL — ABNORMAL HIGH (ref 70–99)
Potassium: 4.5 mmol/L (ref 3.5–5.1)
Sodium: 147 mmol/L — ABNORMAL HIGH (ref 135–145)

## 2018-09-29 LAB — TRIGLYCERIDES: Triglycerides: 491 mg/dL — ABNORMAL HIGH (ref <150)

## 2018-09-29 LAB — CULTURE, RESPIRATORY W GRAM STAIN

## 2018-09-29 LAB — VANCOMYCIN, RANDOM: Vancomycin Rm: 21

## 2018-09-29 LAB — CULTURE, RESPIRATORY

## 2018-09-29 MED ORDER — FUROSEMIDE 10 MG/ML IJ SOLN
60.0000 mg | Freq: Once | INTRAMUSCULAR | Status: AC
  Administered 2018-09-29: 60 mg via INTRAVENOUS
  Filled 2018-09-29: qty 6

## 2018-09-29 MED ORDER — SODIUM CHLORIDE 0.9 % IV SOLN
2.0000 g | INTRAVENOUS | Status: DC
  Administered 2018-09-29 – 2018-10-02 (×4): 2 g via INTRAVENOUS
  Filled 2018-09-29 (×5): qty 2

## 2018-09-29 MED ORDER — VANCOMYCIN HCL 10 G IV SOLR
1250.0000 mg | INTRAVENOUS | Status: DC
  Administered 2018-09-29 – 2018-10-02 (×4): 1250 mg via INTRAVENOUS
  Filled 2018-09-29 (×5): qty 1250

## 2018-09-29 MED ORDER — ROCURONIUM BROMIDE 50 MG/5ML IV SOLN
80.0000 mg | Freq: Once | INTRAVENOUS | Status: AC
  Administered 2018-09-29: 80 mg via INTRAVENOUS
  Filled 2018-09-29: qty 8

## 2018-09-29 MED ORDER — FUROSEMIDE 10 MG/ML IJ SOLN
40.0000 mg | Freq: Once | INTRAMUSCULAR | Status: AC
  Administered 2018-09-29: 40 mg via INTRAVENOUS

## 2018-09-29 MED ORDER — FUROSEMIDE 10 MG/ML IJ SOLN
INTRAMUSCULAR | Status: AC
  Filled 2018-09-29: qty 4

## 2018-09-29 MED ORDER — SODIUM CHLORIDE 0.9 % IV SOLN
0.0000 mg/h | INTRAVENOUS | Status: DC
  Administered 2018-09-29 – 2018-10-02 (×5): 10 mg/h via INTRAVENOUS
  Filled 2018-09-29 (×6): qty 20

## 2018-09-29 NOTE — Progress Notes (Signed)
Physician, residents, RX, RT to bs with addition RN support for respiratory decompensation refractory to bagging, suctioning. Paralytic given per order and lasix given. Pt unable to recover sats beyond 85%; MD and residents continually updated to assessment, interventions and responses. Currently no further orders, continue to observe.

## 2018-09-29 NOTE — Progress Notes (Addendum)
Pharmacy Antibiotic Note  Rodney Hoffman is a 53 y.o. male admitted on 10-05-18 for intubation due to postoperative respiratory failure s/p ORIF surgery. Patient has not improved clinically and is with suspected infection of unknown origin.  Pharmacy has been consulted for broadening antibiotics and to start vancomycin  / cefepime.   Sinus and respiratory culture show staphylococcus aureus, resistant to erythromycin and clindamycin.  Patient has not clinically improved.  Will continue broad sprctrum antibiotics for now.  Patient's vancomycin trough at steady state came back at 21 mcg/ml. This is supratherapeutic. Patient's Scr increased from 2.37 > 3.02 today and will likely increase as patient will be receiving short bursts of lasix.   tMax recently 100.7. WBC slightly bumping upwards from 6.6 > 9.6 > 9.7 as well.  Plan:  Hold vancomycin for 8 hours Reduce Vancomycin to 1250mg  IV every 24 hours Goal AUC: 400-550. Expected AUC: 523. Scr Used: 3.02 Change cefepime to 2g IV every 24 hours Monitor signs/ symptoms of infection, clinical improvement, vancomycin levels at steady-state, renal function and cultures/sensitivities.  Height: 6\' 1"  (185.4 cm) Weight: (!) 341 lb 14.9 oz (155.1 kg) IBW/kg (Calculated) : 79.9  Temp (24hrs), Avg:101.4 F (38.6 C), Min:98.9 F (37.2 C), Max:102.9 F (39.4 C)  Recent Labs  Lab 09/22/18 1504 09/23/18 0550  09/25/18 0345 09/26/18 0419 09/27/18 0341 09/28/18 0331 09/29/18 0331  WBC 9.6 7.2  --  7.0 6.6  --  9.6 9.7  CREATININE 2.90* 2.79*   < > 3.06* 2.43* 2.32* 2.37* 3.02*  LATICACIDVEN 0.9  --   --   --   --   --   --   --    < > = values in this interval not displayed.    Estimated Creatinine Clearance: 43.5 mL/min (A) (by C-G formula based on SCr of 3.02 mg/dL (H)).    No Known Allergies  Antimicrobials this admission: Ceftriaxone 1/22 >> 1/27 Cefepime 1/27 >> Vancomycin 1/27 >>  Dose adjustments this admission: Cefepime 2g IV q12h >  cefepime 2g IV q24h Vancomycin 1500mg  q24h > vancomycin 1250mg  q24h   Microbiology results: 1/27 Sinus Culture: Staphylococcus aureus - resistant to erythro and clinidamycin 1/27 Respiratory Culture: Staphylococcus aureus - resistant to erythro and clinidamycin 1/27 rBCx-3: sent 1/22 rBCx-2: negative  1/18 BCx: strept viridans in 1/4 bottles; sensitive to vancomycin, ceftriaxone, clindamycin 1/16 MRSA PCR: negative  Thank you for allowing pharmacy to be a part of this patient's care.  Bradley Ferris, PharmD 09/29/2018 8:13 AM PGY-1 Pharmacy Resident Direct Phone: [phone] Please check [email or web address] for unit-specific pharmacist phone numbers

## 2018-09-29 NOTE — Progress Notes (Signed)
Spoke with patient's girlfriend, Jerral Ralph, as well as her sister and her close friend about patient's status.  Patient reports that she has been with him for the past 20 years and that he has put her down as his contact for all of his previous hospitalizations.  We discussed that Mr. Salmeron has a poor prognosis and is decompensating and has had very low oxygen levels.  We asked what she thinks he would want for himself if he could see himself in this position.  She reported that he would not want to suffer and would not want to have any aggressive interventions such as CPR.  We discussed CODE STATUS and came to the conclusion that he will be DNR.  We will continue current treatment at this time however I do not believe that he will have much improvement and I anticipate a hospital death.

## 2018-09-29 NOTE — Progress Notes (Signed)
Spoke w patient's friend/ girlfriend Rodney Hoffman in room. She states that she nor any of their mutual friends are aware of any family for the patient. She states she has known him for 20 years, and they have living together off and on and most recently for the past year and a half at her house.  Vicky stated she was called in by nurse due to patient "decompensating." Per Lynden Ang his outlook is very poor, she is not sure he will survive hospital stay. [employer], Worker's [employer] at bedside, aware of prognosis.

## 2018-09-29 NOTE — Progress Notes (Signed)
Marian Sorrow, RN, case manager notified of code status changes.

## 2018-09-29 NOTE — Progress Notes (Signed)
Present with Vicky Autry(significant other), her two close friend, Dr. Everardo All and Dr. Gwyneth Revels for discussion about goals of care. Lynden Ang states she is very sure that patient would not want to have further heroic measures if his condition deteriorates to requiring CPR. Patient made DNR by Dr. Everardo All.

## 2018-09-29 NOTE — Progress Notes (Signed)
NAME:  Rodney Hoffman, MRN:  [id number], DOB:  09/22/1965, LOS: 14 ADMISSION DATE:  09/29/2018, CONSULTATION DATE:  1/16 REFERRING MD:  Dr. Victorino Dike, CHIEF COMPLAINT:  Post op respiratory failure  Brief History   This is a 53 year old male with a history of HTN, OSA and DM who after getting a ORIF of his left fibular and tibial developed hypoxic and hypercarbic respiratory failure requiring intubation and transfer to [facility] and the ICU for airway management.  History of present illness   53 year old male with past medical history as below, which is significant for tobacco abuse and diabetes.  The patient is encephalopathic and therefore history is obtained largely from chart review.  He suffered a left ankle fracture about a year ago and after some lifestyle modification and better management of his diabetes he presented on 1/16 to the outpatient surgery center for left ankle ORIF under Dr. Victorino Dike.  He was extubated immediately postoperatively, however, he then developed hypoxemia with sats down into the low 80s and altered mental status progressing to obtundation.  He was transferred to [facility] for airway management.  He was given 120 mg IV lasix and had poor output, he became profoundly hypotensive. Bedside u/s showed dilated RV and hyperdynamic LV. Cardiology called and patient was started on levophed drip. He initially had respiratory acidosis and was started on bicarbonate drip, follow up ABG showed respiratory alkalosis so bicarbonate drip was stopped.    Past Medical History   Past Medical History:  Diagnosis Date  . Diabetes mellitus without complication (HCC)   . Hypertension   . Sleep apnea    does not wear CPAP     Significant Hospital Events   1/16 ORIF L ankle. Failed extubation. Transfer to [facility] 1/23 > Failed extubation, [facility] called for respiratory distress and pulseless V tach and reintubated  Consults:    Procedures:  1/16 L ankle ORIF (Dr.  Victorino Dike) 1/16 ETT 1/23 ETT  Significant Diagnostic Tests:   Echocardiogram 09/16/18 Impressions:  - Normal LV size with moderate LV hypertrophy. EF 50-55%, low   normal to mildly decreased. Basal inferior akinesis. Mildly   dilated RV with mild to moderately decreased systolic function.   Dilated IVC suggestive of elevated RV filling pressure.  CT head 09/21/18   IMPRESSION: No acute intracranial normality.  Pansinus disease and bilateral mastoid effusions.  CT Chest 09/27/18 IMPRESSION: 1. Nonspecific diffuse bilateral consolidation, greatest in the UPPER lungs. This may represent edema and/or infection. 2. Small bilateral pleural effusions and moderate bilateral LOWER lung consolidation/atelectasis. 3. Cardiomegaly.  Micro Data:   Blood cultures 1/18 > strep viridans in 1/4 bottles, possible contaminant Blood cultures 1/22 > NGTD Blood cultures 1/27> no growth to date Respiratory culture 1/27> rare staph aureus Antimicrobials:      Interim history/subjective:  Overnight patient was found to have desaturations down to low 60s, had to be bagged and FiO2 was increased to 100%, O2 came up to 88-90%. It occurred again later in the night with a O2 of around 86% and PEEP was increased to 14.  Patient was on 100% FiO2, PEEP 12. On rounds patient desated down to 70%, patietn was bagged and given rocuronium and given lasix 40.   Objective   Blood pressure (!) 118/57, pulse 81, temperature 98.9 F (37.2 C), temperature source Oral, resp. rate (!) 24, height 6\' 1"  (1.854 m), weight (!) 155.1 kg, SpO2 90 %. CVP:  [16 mmHg-18 mmHg] 18 mmHg  Vent  Mode: PRVC FiO2 (%):  [60 %-100 %] 100 % Set Rate:  [24 bmp] 24 bmp Vt Set:  [550 mL] 550 mL PEEP:  [12 cmH20-14 cmH20] 14 cmH20 Plateau Pressure:  [30 cmH20-35 cmH20] 32 cmH20   Intake/Output Summary (Last 24 hours) at 09/29/2018 0705 Last data filed at 09/29/2018 0700 Gross per 24 hour  Intake 3991.32 ml  Output 1435 ml  Net  2556.32 ml   Filed Weights   09/27/18 0500 09/28/18 0410 09/29/18 0421  Weight: (!) 153.8 kg (!) 154.5 kg (!) 155.1 kg    Examination: General: Intubated, left IJ central line in place, right femoral line in place, obese male, sedated, not following commands  HENT: Normocephalic, atraumatic Lungs: Diffuse rhonchi, on ventilator Cardiovascular: RRR, no m/r/g Abdomen: soft, no guarding Extremities: Left leg in splint, trace edema Neuro: Sedated, not following commands GU: Foley in place  Resolved Hospital Problem list     Assessment & Plan:   Acute hypoxic respiratory failure:  Currently intubated: Respiratory acidosis: Patient has been having multiple desaturation episodes requiring bagging a high FiO2 and PEEP settings.  Also still been having fevers as well as positive neck input of +2500.  During rounds patient desaturated down to home with a 70% and required bagging.  He was given 100 mg rectal exam to paralyze him.  Patient was still on high FiO2 and PEEP settings and however still remained in the mid ED percent.  Overall patient does not seem to be doing well and has refractory ARDS.  We will attempt to diurese him even though he is on pressors due to his at positive fluid balance and worsening ARDS. -Continue Vanco and cefepime -Pulmonary hygiene  -Continue versed and propofol drip and dilaudid drip -Continue Seroquel 100 mg twice daily -Continue Klonopin 1 mg q6 hr -S/p 100 mg rocuronium -Continue hydralazine 5 mg q4hr PRN for hypertension -Plan for tracheostomy at some point if ventilator setting can come down -We will give Lasix 40 mg daily, monitor fluid output  Shock: Most likely multifactorial in the setting of attempted diuresis, grade 1 diastolic dysfunction, dilated right ventricle, elevated right ventricle pressures made worse by propofol drip. Patient is still requiring a low-dose of Levophed however this is likely due to his high amount of sedative  medications -Cortisol level > 134 -Blood Cultures > strep viridans growing in 1/4 bottles -Repeat blood cultures > no growth to date  AKI: -Creatinine improving, will continue to monitor.   -Daily BMP  Hypernatremia: -Sodium 149 -Continue free water to 200 q4 hour -Repeat BMP in AM  Left ankle injury -S/p ORIF on 1/16 by Dr. Victorino DikeHewitt -Per ortho >> reinforce splint as needed, will need PT/OT as he becomes stable. No evidence of lymphedema or streaking up left leg.   DM: -SSI -Frequent CBGs   HTN: -Patient is on lisinopril at home, BP today was on the softer side and he had required a levoped drip.  -Hold Lisinopril  -Can restart as necessary   Constipation: -Patient has not had a bowel movement for bowel movement.  He had mag citrate added yesterday and is still not having any bowel movement.  He also have colace and senokot. -Enema versus lactulose -Hold tube feeds  Best practice:  Diet: N.p.o. Pain/Anxiety/Delirium protocol (if indicated): Versed and dilaudid for RASS -1 to -2 VAP protocol (if indicated): Yes DVT prophylaxis: Heparin GI prophylaxis: PPI Glucose control: SSI Mobility: Bed ridden Code Status: Full Family Communication:  Disposition: Remain in ICU  Labs  CBC: Recent Labs  Lab 09/23/18 0550  09/25/18 0345 09/26/18 0419 09/26/18 1458 09/28/18 0331 09/29/18 0331 09/29/18 0340  WBC 7.2  --  7.0 6.6  --  9.6 9.7  --   NEUTROABS 5.0  --   --   --   --   --   --   --   HGB 12.3*   < > 11.5* 11.4* 10.9* 11.7* 11.5* 11.9*  HCT 42.9   < > 39.4 38.5* 32.0* 42.3 39.5 35.0*  MCV 97.9  --  97.0 97.0  --  97.9 98.8  --   PLT 250  --  274 301  --  337 351  --    < > = values in this interval not displayed.  3  Basic Metabolic Panel: Recent Labs  Lab 09/24/18 0343 09/24/18 1459 09/24/18 2238 09/25/18 0345 09/25/18 1640 09/26/18 0419 09/26/18 1458 09/27/18 0341 09/28/18 0331 09/29/18 0331 09/29/18 0340  NA  --  151*  --  150*  --  148* 151*  150* 147* 147* 149*  K  --  3.2*  --  3.7  --  3.5 3.5 3.4* 3.8 4.0 4.1  CL  --  114*  --  113*  --  112*  --  114* 115* 115*  --   CO2  --  27  --  26  --  24  --  25 21* 22  --   GLUCOSE  --  104*  --  143*  --  148*  --  136* 168* 153*  --   BUN  --  62*  --  57*  --  52*  --  57* 62* 79*  --   CREATININE  --  3.41*  --  3.06*  --  2.43*  --  2.32* 2.37* 3.02*  --   CALCIUM  --  8.2*  --  8.2*  --  8.3*  --  8.2* 8.0* 7.7*  --   MG 2.2 2.3 2.3 2.3 2.3  --   --   --   --   --   --   PHOS 5.5* 5.3* 4.8* 4.4 3.5  --   --   --   --   --   --    GFR: Estimated Creatinine Clearance: 43.5 mL/min (A) (by C-G formula based on SCr of 3.02 mg/dL (H)). Recent Labs  Lab 09/22/18 1504  09/25/18 0345 09/26/18 0419 09/28/18 0331 09/29/18 0331  WBC 9.6   < > 7.0 6.6 9.6 9.7  LATICACIDVEN 0.9  --   --   --   --   --    < > = values in this interval not displayed.    Liver Function Tests: Recent Labs  Lab 09/22/18 1504 09/26/18 0419 09/29/18 0331  AST 74* 34 33  ALT 68* 44 37  ALKPHOS 113 81 84  BILITOT 1.3* 1.4* 2.2*  PROT 6.6 6.2* 5.6*  ALBUMIN 2.5* 2.2* 1.7*   No results for input(s): LIPASE, AMYLASE in the last 168 hours. No results for input(s): AMMONIA in the last 168 hours.  ABG    Component Value Date/Time   PHART 7.339 (L) 09/29/2018 0340   PCO2ART 44.9 09/29/2018 0340   PO2ART 51.0 (L) 09/29/2018 0340   HCO3 24.1 09/29/2018 0340   TCO2 25 09/29/2018 0340   ACIDBASEDEF 2.0 09/29/2018 0340   O2SAT 83.0 09/29/2018 0340     Coagulation Profile: No results for input(s): INR, PROTIME in the last 168 hours.  Cardiac Enzymes: Recent Labs  Lab 09/22/18 1648  TROPONINI 0.21*    HbA1C: No results found for: HGBA1C  CBG: Recent Labs  Lab 09/28/18 1109 09/28/18 1551 09/28/18 1934 09/28/18 2343 09/29/18 0350  GLUCAP 160* 131* 156* 158* 136*    Review of Systems:   Currently intubated and sedated  Past Medical History  He,  has a past medical history of  Diabetes mellitus without complication (HCC), Hypertension, and Sleep apnea.   Surgical History    Past Surgical History:  Procedure Laterality Date  . APPENDECTOMY  1979  . ORIF ANKLE FRACTURE Left 09/23/2018   Procedure: Left fibular and tibial open treatment with internal fixation;  Surgeon: Toni ArthursHewitt, John, MD;  Location: [facility];  Service: Orthopedics;  Laterality: Left;  120min  Outpatient in bed     Social History   reports that he quit smoking about 2 months ago. His smoking use included cigarettes. He has never used smokeless tobacco. He reports previous alcohol use. He reports that he does not use drugs.   Family History   His family history is not on file.   Allergies No Known Allergies   Home Medications  Prior to Admission medications   Medication Sig Start Date End Date Taking? Authorizing Provider  glipiZIDE (GLUCOTROL) 5 MG tablet Take by mouth 2 (two) times daily before a meal.   Yes [provider]  lisinopril (PRINIVIL,ZESTRIL) 10 MG tablet Take 10 mg by mouth daily.   Yes [provider]  metFORMIN (GLUCOPHAGE) 500 MG tablet Take by mouth 2 (two) times daily with a meal.   Yes [provider]  aspirin EC 81 MG tablet Take 1 tablet (81 mg total) by mouth 2 (two) times daily. 09/06/2018   Jacinta Shoellis, Justin Pike, PA-C  docusate sodium (COLACE) 100 MG capsule Take 1 capsule (100 mg total) by mouth 2 (two) times daily. While taking narcotic pain medicine. 09/30/2018   Jacinta Shoellis, Justin Pike, PA-C  oxyCODONE (ROXICODONE) 5 MG immediate release tablet Take 1 tablet (5 mg total) by mouth every 4 (four) hours as needed for up to 5 days for severe pain. 09/21/2018 09/20/18  Jacinta Shoellis, Justin Pike, PA-C  senna (SENOKOT) 8.6 MG TABS tablet Take 2 tablets (17.2 mg total) by mouth 2 (two) times daily. 08/31/2018   Jacinta Shoellis, Justin Pike, PA-C     Critical care time:     Claudean SeveranceMarissa M Sharlena Kristensen, M.D. PGY1 Pager [phone] 09/29/2018 7:05 AM

## 2018-09-30 LAB — POCT I-STAT 7, (LYTES, BLD GAS, ICA,H+H)
Acid-base deficit: 4 mmol/L — ABNORMAL HIGH (ref 0.0–2.0)
Bicarbonate: 22.3 mmol/L (ref 20.0–28.0)
Calcium, Ion: 1.09 mmol/L — ABNORMAL LOW (ref 1.15–1.40)
HCT: 32 % — ABNORMAL LOW (ref 39.0–52.0)
Hemoglobin: 10.9 g/dL — ABNORMAL LOW (ref 13.0–17.0)
O2 Saturation: 74 %
PH ART: 7.302 — AB (ref 7.350–7.450)
PO2 ART: 43 mmHg — AB (ref 83.0–108.0)
Potassium: 4.3 mmol/L (ref 3.5–5.1)
Sodium: 148 mmol/L — ABNORMAL HIGH (ref 135–145)
TCO2: 24 mmol/L (ref 22–32)
pCO2 arterial: 45.1 mmHg (ref 32.0–48.0)

## 2018-09-30 LAB — CBC
HCT: 36.3 % — ABNORMAL LOW (ref 39.0–52.0)
Hemoglobin: 10.2 g/dL — ABNORMAL LOW (ref 13.0–17.0)
MCH: 27.6 pg (ref 26.0–34.0)
MCHC: 28.1 g/dL — ABNORMAL LOW (ref 30.0–36.0)
MCV: 98.4 fL (ref 80.0–100.0)
Platelets: 330 10*3/uL (ref 150–400)
RBC: 3.69 MIL/uL — ABNORMAL LOW (ref 4.22–5.81)
RDW: 15.3 % (ref 11.5–15.5)
WBC: 9.5 10*3/uL (ref 4.0–10.5)
nRBC: 0 % (ref 0.0–0.2)

## 2018-09-30 LAB — BASIC METABOLIC PANEL
Anion gap: 10 (ref 5–15)
BUN: 101 mg/dL — ABNORMAL HIGH (ref 6–20)
CO2: 22 mmol/L (ref 22–32)
CREATININE: 4.88 mg/dL — AB (ref 0.61–1.24)
Calcium: 7.9 mg/dL — ABNORMAL LOW (ref 8.9–10.3)
Chloride: 116 mmol/L — ABNORMAL HIGH (ref 98–111)
GFR calc non Af Amer: 12 mL/min — ABNORMAL LOW (ref >60)
GFR, EST AFRICAN AMERICAN: 14 mL/min — AB (ref >60)
Glucose, Bld: 119 mg/dL — ABNORMAL HIGH (ref 70–99)
Potassium: 4.4 mmol/L (ref 3.5–5.1)
Sodium: 148 mmol/L — ABNORMAL HIGH (ref 135–145)

## 2018-09-30 LAB — GLUCOSE, CAPILLARY
Glucose-Capillary: 101 mg/dL — ABNORMAL HIGH (ref 70–99)
Glucose-Capillary: 104 mg/dL — ABNORMAL HIGH (ref 70–99)
Glucose-Capillary: 106 mg/dL — ABNORMAL HIGH (ref 70–99)
Glucose-Capillary: 111 mg/dL — ABNORMAL HIGH (ref 70–99)
Glucose-Capillary: 113 mg/dL — ABNORMAL HIGH (ref 70–99)
Glucose-Capillary: 91 mg/dL (ref 70–99)

## 2018-09-30 MED ORDER — IPRATROPIUM-ALBUTEROL 0.5-2.5 (3) MG/3ML IN SOLN
3.0000 mL | Freq: Four times a day (QID) | RESPIRATORY_TRACT | Status: DC
  Administered 2018-09-30 – 2018-10-03 (×13): 3 mL via RESPIRATORY_TRACT
  Filled 2018-09-30: qty 3
  Filled 2018-09-30: qty 6
  Filled 2018-09-30 (×11): qty 3

## 2018-09-30 NOTE — Progress Notes (Signed)
Pulled ET tube back 3 to 23 at the lip  Found at 26 at lip

## 2018-09-30 NOTE — Plan of Care (Signed)
  Problem: Education: Goal: Knowledge of General Education information will improve Description Including pain rating scale, medication(s)/side effects and non-pharmacologic comfort measures Outcome: Not Progressing   Problem: Health Behavior/Discharge Planning: Goal: Ability to manage health-related needs will improve Outcome: Not Progressing   Problem: Clinical Measurements: Goal: Ability to maintain clinical measurements within normal limits will improve Outcome: Not Progressing Goal: Will remain free from infection Outcome: Not Progressing Goal: Diagnostic test results will improve Outcome: Not Progressing Goal: Respiratory complications will improve Outcome: Not Progressing Goal: Cardiovascular complication will be avoided Outcome: Not Progressing   Problem: Activity: Goal: Risk for activity intolerance will decrease Outcome: Not Progressing   Problem: Nutrition: Goal: Adequate nutrition will be maintained Outcome: Not Progressing   Problem: Coping: Goal: Level of anxiety will decrease Outcome: Not Progressing   Problem: Elimination: Goal: Will not experience complications related to bowel motility Outcome: Not Progressing Goal: Will not experience complications related to urinary retention Outcome: Not Progressing   Problem: Pain Managment: Goal: General experience of comfort will improve Outcome: Not Progressing   Problem: Safety: Goal: Ability to remain free from injury will improve Outcome: Not Progressing   Problem: Skin Integrity: Goal: Risk for impaired skin integrity will decrease Outcome: Not Progressing   Problem: Activity: Goal: Ability to tolerate increased activity will improve Outcome: Not Progressing   Problem: Respiratory: Goal: Ability to maintain a clear airway and adequate ventilation will improve Outcome: Not Progressing   Problem: Role Relationship: Goal: Method of communication will improve Outcome: Not Progressing   Continues  to decline. Not responsive to current treatments. Not likely to survive to discharge. Support system aware and updated regularly.

## 2018-09-30 NOTE — Progress Notes (Signed)
NAME:  Rodney Hoffman, MRN:  [id number], DOB:  15-Sep-1965, LOS: 15 ADMISSION DATE:  09/17/2018, CONSULTATION DATE:  1/16 REFERRING MD:  Dr. Victorino Dike, CHIEF COMPLAINT:  Post op respiratory failure  Brief History   This is a 53 year old male with a history of HTN, OSA and DM who after getting a ORIF of his left fibular and tibial developed hypoxic and hypercarbic respiratory failure requiring intubation and transfer to [facility] and the ICU for airway management.  History of present illness   53 year old male with past medical history as below, which is significant for tobacco abuse and diabetes.  The patient is encephalopathic and therefore history is obtained largely from chart review.  He suffered a left ankle fracture about a year ago and after some lifestyle modification and better management of his diabetes he presented on 1/16 to the outpatient surgery center for left ankle ORIF under Dr. Victorino Dike.  He was extubated immediately postoperatively, however, he then developed hypoxemia with sats down into the low 80s and altered mental status progressing to obtundation.  He was transferred to [facility] and [facility]M for airway management.  He was given 120 mg IV lasix and had poor output, he became profoundly hypotensive. Bedside u/s showed dilated RV and hyperdynamic LV. Cardiology called and patient was started on levophed drip. He initially had respiratory acidosis and was started on bicarbonate drip, follow up ABG showed respiratory alkalosis so bicarbonate drip was stopped.    Past Medical History   Past Medical History:  Diagnosis Date  . Diabetes mellitus without complication (HCC)   . Hypertension   . Sleep apnea    does not wear CPAP     Significant Hospital Events   1/16 ORIF L ankle. Failed extubation. Transfer to [facility]M 1/23 > Failed extubation, CODE BLUE called for respiratory distress and pulseless V tach and reintubated  Consults:    Procedures:  1/16 L ankle ORIF (Dr.  Victorino Dike) 1/16 ETT 1/23 ETT  Significant Diagnostic Tests:   Echocardiogram 09/16/18 Impressions:  - Normal LV size with moderate LV hypertrophy. EF 50-55%, low   normal to mildly decreased. Basal inferior akinesis. Mildly   dilated RV with mild to moderately decreased systolic function.   Dilated IVC suggestive of elevated RV filling pressure.  CT head 09/21/18   IMPRESSION: No acute intracranial normality.  Pansinus disease and bilateral mastoid effusions.  CT Chest 09/27/18 IMPRESSION: 1. Nonspecific diffuse bilateral consolidation, greatest in the UPPER lungs. This may represent edema and/or infection. 2. Small bilateral pleural effusions and moderate bilateral LOWER lung consolidation/atelectasis. 3. Cardiomegaly.  Micro Data:   Blood cultures 1/18 > strep viridans in 1/4 bottles, possible contaminant Blood cultures 1/22 > NGTD Blood cultures 1/27> no growth to date Respiratory culture 1/27> rare staph aureus Antimicrobials:      Interim history/subjective:  Overnight patient had no acute events. His girlfriend, her friends and her family came by to visit him last night. Patient is currently intubated and sedated. No visitors at bedside.   Objective   Blood pressure (!) 90/51, pulse 76, temperature (!) 101.1 F (38.4 C), temperature source Axillary, resp. rate (!) 24, height 6\' 1"  (1.854 m), weight (!) 159.6 kg, SpO2 (!) 84 %.    Vent Mode: PRVC FiO2 (%):  [100 %] 100 % Set Rate:  [24 bmp] 24 bmp Vt Set:  [550 mL] 550 mL PEEP:  [14 cmH20] 14 cmH20 Plateau Pressure:  [35 cmH20-37 cmH20] 35 cmH20   Intake/Output Summary (Last 24  hours) at 09/30/2018 0654 Last data filed at 09/30/2018 0400 Gross per 24 hour  Intake 1344.39 ml  Output 405 ml  Net 939.39 ml   Filed Weights   09/28/18 0410 09/29/18 0421 09/30/18 0447  Weight: (!) 154.5 kg (!) 155.1 kg (!) 159.6 kg    Examination: General: Intubated, left IJ central line in place, right femoral line in  place, obese male, sedated, not following commands  HENT: Normocephalic, atraumatic Lungs: Diffuse rhonchi, on ventilator Cardiovascular: RRR, no m/r/g Abdomen: soft, no guarding Extremities: Left leg in splint, 1+ BL LE edema, anasarca Neuro: Sedated, not following commands GU: Foley in place  Resolved Hospital Problem list     Assessment & Plan:   ARDS: Acute hypoxic respiratory failure:  Currently intubated: Respiratory acidosis: Patient had multiple episode of desaturations yesterday, he is on high ventilator settings, no on FiO2 100%, PEEP 14, RR 24, and TV 550. His oxygenation runs in the jmid 80's right now. Even though he in on vasopressors we gave him 160 mg IV lasix yesterday to try and diurese him. He had minimal output, he is still up 1L today and has worsening kidney injury. He is also still having a fever up to 101.1, continuing on the cefepime and vancomycin. Unfortunately given his current ventilator settings he does not appear to be a candidate for a tracheostomy at this time.  -Pulmonary hygiene  -Continue versed and propofol drip and dilaudid drip -Continue Seroquel 100 mg twice daily -Continue Klonopin 1 mg q6 hr -Continue hydralazine 5 mg q4hr PRN for hypertension -Continue vancomycin and cefepime, 7 days total  Septic Shock:  -Patient is still requiring levophed to maintain his blood pressure, however this also my be related to his current sedative medications.  -Continue vancomycin and cefepime, 7 day course  AKI: -Creatinine worsening, however given his fluid overload and worsening hypoxia we tried to diurese him yesterday.   -Daily BMP  Hypernatremia: -Sodium 148, continue to monitor for now -Discontinue free water due to volume overload -Repeat BMP in AM  Left ankle injury -S/p ORIF on 1/16 by Dr. Victorino DikeHewitt -Per ortho >> reinforce splint as needed, will need PT/OT as he becomes stable. No evidence of lymphedema or streaking up left leg.    DM: -SSI -Frequent CBGs   HTN: -Patient is on lisinopril at home, BP today was on the softer side and he had required a levoped drip.  -Hold Lisinopril  -Can restart as necessary   Constipation: -Patient has not had a bowel movement for bowel movement.  He had mag citrate added yesterday and is still not having any bowel movement.  He also have colace and senokot. -Enema versus lactulose -Hold tube feeds  Best practice:  Diet: N.p.o. Pain/Anxiety/Delirium protocol (if indicated): Versed and dilaudid for RASS -1 to -2 VAP protocol (if indicated): Yes DVT prophylaxis: Heparin GI prophylaxis: PPI Glucose control: SSI Mobility: Bed ridden Code Status: Full Family Communication:  Disposition: Remain in ICU  Labs   CBC: Recent Labs  Lab 09/25/18 0345 09/26/18 0419 09/26/18 1458 09/28/18 0331 09/29/18 0331 09/29/18 0340  WBC 7.0 6.6  --  9.6 9.7  --   HGB 11.5* 11.4* 10.9* 11.7* 11.5* 11.9*  HCT 39.4 38.5* 32.0* 42.3 39.5 35.0*  MCV 97.0 97.0  --  97.9 98.8  --   PLT 274 301  --  337 351  --   3  Basic Metabolic Panel: Recent Labs  Lab 09/24/18 0343  09/24/18 1459 09/24/18 2238 09/25/18 0345  09/25/18 1640 09/26/18 0419  09/27/18 0341 09/28/18 0331 09/29/18 0331 09/29/18 0340 09/29/18 1630  NA  --   --  151*  --  150*  --  148*   < > 150* 147* 147* 149* 147*  K  --   --  3.2*  --  3.7  --  3.5   < > 3.4* 3.8 4.0 4.1 4.5  CL  --    < > 114*  --  113*  --  112*  --  114* 115* 115*  --  117*  CO2  --    < > 27  --  26  --  24  --  25 21* 22  --  19*  GLUCOSE  --    < > 104*  --  143*  --  148*  --  136* 168* 153*  --  121*  BUN  --    < > 62*  --  57*  --  52*  --  57* 62* 79*  --  90*  CREATININE  --    < > 3.41*  --  3.06*  --  2.43*  --  2.32* 2.37* 3.02*  --  3.74*  CALCIUM  --    < > 8.2*  --  8.2*  --  8.3*  --  8.2* 8.0* 7.7*  --  7.8*  MG 2.2  --  2.3 2.3 2.3 2.3  --   --   --   --   --   --   --   PHOS 5.5*  --  5.3* 4.8* 4.4 3.5  --   --   --   --    --   --   --    < > = values in this interval not displayed.   GFR: Estimated Creatinine Clearance: 35.7 mL/min (A) (by C-G formula based on SCr of 3.74 mg/dL (H)). Recent Labs  Lab 09/25/18 0345 09/26/18 0419 09/28/18 0331 09/29/18 0331  WBC 7.0 6.6 9.6 9.7    Liver Function Tests: Recent Labs  Lab 09/26/18 0419 09/29/18 0331  AST 34 33  ALT 44 37  ALKPHOS 81 84  BILITOT 1.4* 2.2*  PROT 6.2* 5.6*  ALBUMIN 2.2* 1.7*   No results for input(s): LIPASE, AMYLASE in the last 168 hours. No results for input(s): AMMONIA in the last 168 hours.  ABG    Component Value Date/Time   PHART 7.339 (L) 09/29/2018 0340   PCO2ART 44.9 09/29/2018 0340   PO2ART 51.0 (L) 09/29/2018 0340   HCO3 24.1 09/29/2018 0340   TCO2 25 09/29/2018 0340   ACIDBASEDEF 2.0 09/29/2018 0340   O2SAT 83.0 09/29/2018 0340     Coagulation Profile: No results for input(s): INR, PROTIME in the last 168 hours.  Cardiac Enzymes: No results for input(s): CKTOTAL, CKMB, CKMBINDEX, TROPONINI in the last 168 hours.  HbA1C: No results found for: HGBA1C  CBG: Recent Labs  Lab 09/29/18 1231 09/29/18 1603 09/29/18 2036 09/30/18 0025 09/30/18 0413  GLUCAP 129* 118* 94 91 104*    Review of Systems:   Currently intubated and sedated  Past Medical History  He,  has a past medical history of Diabetes mellitus without complication (HCC), Hypertension, and Sleep apnea.   Surgical History    Past Surgical History:  Procedure Laterality Date  . APPENDECTOMY  1979  . ORIF ANKLE FRACTURE Left 2019/08/07   Procedure: Left fibular and tibial open treatment with internal fixation;  Surgeon: Toni ArthursHewitt, John, MD;  Location: [facility];  Service: Orthopedics;  Laterality: Left;   Outpatient in bed     Social History   reports that he quit smoking about 2 months ago. His smoking use included cigarettes. He has never used smokeless tobacco. He reports previous alcohol use. He reports that he  does not use drugs.   Family History   His family history is not on file.   Allergies No Known Allergies   Home Medications  Prior to Admission medications   Medication Sig Start Date End Date Taking? Authorizing Provider  glipiZIDE (GLUCOTROL) 5 MG tablet Take by mouth 2 (two) times daily before a meal.   Yes [provider]  lisinopril (PRINIVIL,ZESTRIL) 10 MG tablet Take 10 mg by mouth daily.   Yes [provider]  metFORMIN (GLUCOPHAGE) 500 MG tablet Take by mouth 2 (two) times daily with a meal.   Yes [provider]  aspirin EC 81 MG tablet Take 1 tablet (81 mg total) by mouth 2 (two) times daily. September 18, 2018   Jacinta Shoe, PA-C  docusate sodium (COLACE) 100 MG capsule Take 1 capsule (100 mg total) by mouth 2 (two) times daily. While taking narcotic pain medicine. 2018-09-18   Jacinta Shoe, PA-C  oxyCODONE (ROXICODONE) 5 MG immediate release tablet Take 1 tablet (5 mg total) by mouth every 4 (four) hours as needed for up to 5 days for severe pain. 09-18-18 09/20/18  Jacinta Shoe, PA-C  senna (SENOKOT) 8.6 MG TABS tablet Take 2 tablets (17.2 mg total) by mouth 2 (two) times daily. 2018/09/18   Jacinta Shoe, PA-C     Critical care time:     Claudean Severance, M.D. PGY1 Pager [phone] 09/30/2018 6:54 AM

## 2018-10-01 LAB — GLUCOSE, CAPILLARY
Glucose-Capillary: 103 mg/dL — ABNORMAL HIGH (ref 70–99)
Glucose-Capillary: 108 mg/dL — ABNORMAL HIGH (ref 70–99)
Glucose-Capillary: 109 mg/dL — ABNORMAL HIGH (ref 70–99)
Glucose-Capillary: 123 mg/dL — ABNORMAL HIGH (ref 70–99)
Glucose-Capillary: 123 mg/dL — ABNORMAL HIGH (ref 70–99)
Glucose-Capillary: 79 mg/dL (ref 70–99)

## 2018-10-01 LAB — CULTURE, BLOOD (ROUTINE X 2)
Culture: NO GROWTH
Culture: NO GROWTH
Special Requests: ADEQUATE
Special Requests: ADEQUATE

## 2018-10-01 MED ORDER — NOREPINEPHRINE 16 MG/250ML-% IV SOLN
0.0000 ug/min | INTRAVENOUS | Status: DC
  Administered 2018-10-01: 22 ug/min via INTRAVENOUS
  Administered 2018-10-02: 23 ug/min via INTRAVENOUS
  Administered 2018-10-02: 25 ug/min via INTRAVENOUS
  Administered 2018-10-03: 19 ug/min via INTRAVENOUS
  Filled 2018-10-01 (×4): qty 250

## 2018-10-01 MED ORDER — ROCURONIUM BROMIDE 50 MG/5ML IV SOLN
100.0000 mg | Freq: Once | INTRAVENOUS | Status: AC
  Administered 2018-10-01: 100 mg via INTRAVENOUS
  Filled 2018-10-01: qty 10

## 2018-10-01 NOTE — Progress Notes (Signed)
NAME:  Rodney Hoffman, MRN:  [id number], DOB:  11/02/1965, LOS: 16 ADMISSION DATE:  09/07/2018, CONSULTATION DATE:  1/16 REFERRING MD:  Dr. Victorino DikeHewitt, CHIEF COMPLAINT:  Post op respiratory failure  Brief History   This is a 53 year old male with a history of HTN, OSA and DM who after getting a ORIF of his left fibular and tibial developed hypoxic and hypercarbic respiratory failure requiring intubation and transfer to [facility] and the ICU for airway management.  History of present illness   53 year old male with past medical history as below, which is significant for tobacco abuse and diabetes.  The patient is encephalopathic and therefore history is obtained largely from chart review.  He suffered a left ankle fracture about a year ago and after some lifestyle modification and better management of his diabetes he presented on 1/16 to the outpatient surgery center for left ankle ORIF under Dr. Victorino DikeHewitt.  He was extubated immediately postoperatively, however, he then developed hypoxemia with sats down into the low 80s and altered mental status progressing to obtundation.  [facility] for airway management.  He was given 120 mg IV lasix and had poor output, he became profoundly hypotensive. Bedside u/s showed dilated RV and hyperdynamic LV. Cardiology called and patient was started on levophed drip. He initially had respiratory acidosis and was started on bicarbonate drip, follow up ABG showed respiratory alkalosis so bicarbonate drip was stopped.    Past Medical History   Past Medical History:  Diagnosis Date  . Diabetes mellitus without complication (HCC)   . Hypertension   . Sleep apnea    does not wear CPAP     Significant Hospital Events   1/16 ORIF L ankle. Failed extubation. Transfer to [facility] 1/23 > Failed extubation, CODE BLUE called for respiratory distress and pulseless V tach and reintubated  Consults:    Procedures:  1/16 L ankle ORIF (Dr.  Victorino DikeHewitt) 1/16 ETT 1/23 ETT  Significant Diagnostic Tests:   Echocardiogram 09/16/18 Impressions:  - Normal LV size with moderate LV hypertrophy. EF 50-55%, low   normal to mildly decreased. Basal inferior akinesis. Mildly   dilated RV with mild to moderately decreased systolic function.   Dilated IVC suggestive of elevated RV filling pressure.  CT head 09/21/18   IMPRESSION: No acute intracranial normality.  Pansinus disease and bilateral mastoid effusions.  CT Chest 09/27/18 IMPRESSION: 1. Nonspecific diffuse bilateral consolidation, greatest in the UPPER lungs. This may represent edema and/or infection. 2. Small bilateral pleural effusions and moderate bilateral LOWER lung consolidation/atelectasis. 3. Cardiomegaly.  Micro Data:   Blood cultures 1/18 > strep viridans in 1/4 bottles, possible contaminant Blood cultures 1/22 > NGTD Blood cultures 1/27> no growth to date Respiratory culture 1/27> rare staph aureus Antimicrobials:      Interim history/subjective:  Worsening hypoxemia overnight despite maximal vent settings. Given additional paralytic trial with no change in hypoxemia this morning.  Discussed case with risk management as documented per my attestation on 09/30/18 progress note, patient with no living relatives. Ms. Cherlynn Poloutry, who has lived with patient and has an established relationship of >20 years and acts in good faith on patients half, states that patient would not wish to pursue aggressive care if this would not provide meaningful recovery. Per discussion, patient transitioned to DNR.  Objective   Blood pressure (!) 110/58, pulse 99, temperature 99.8 F (37.7 C), temperature source Oral, resp. rate (!) 24, height 6\' 1"  (1.854 m), weight (!) 160.8  kg, SpO2 (!) 70 %.    Vent Mode: PRVC FiO2 (%):  [100 %] 100 % Set Rate:  [14 bmp-24 bmp] 14 bmp Vt Set:  [550 mL] 550 mL PEEP:  [5 cmH20-14 cmH20] 14 cmH20 Plateau Pressure:  [27 cmH20-38 cmH20] 37 cmH20    Intake/Output Summary (Last 24 hours) at 10/01/2018 2314 Last data filed at 10/01/2018 2000 Gross per 24 hour  Intake 1705.67 ml  Output 22 ml  Net 1683.67 ml   Filed Weights   09/29/18 0421 09/30/18 0447 10/01/18 0500  Weight: (!) 155.1 kg (!) 159.6 kg (!) 160.8 kg    Examination: General: Intubated, left IJ central line in place, right femoral line in place, obese male, sedated, not following commands  HENT: Normocephalic, atraumatic Lungs: Diffuse rhonchi, on ventilator Cardiovascular: RRR, no m/r/g Abdomen: soft, no guarding Extremities: Left leg in splint, 1+ BL LE edema, anasarca Neuro: Sedated, not following commands GU: Foley in place  Physical Exam: General: MO Critically ill appearing male, intubated HENT: [place], AT, ETT in place Eyes: EOMI, no scleral icterus Respiratory: Coarse breath sounds bilaterally, no wheezing. Cardiovascular: RRR, -M/R/G, no JVD GI: BS+, soft, nontender Extremities:Left leg cast, 1+ pitting edema Neuro: Sedated, unable to follow commands Skin: Intact, no rashes or bruising GU: Foley in place  Resolved Hospital Problem list     Assessment & Plan:   ARDS: Acute hypoxic respiratory failure:  Unresponsive to trials of paralytics. Given co-comitant shock and physical limitations, proning is contraindicated and may likely cause more harm than benefit -Full vent support -Continue broad spectrum antibiotics -Sedate with RASS goal -5  Septic Shock:  -Continue pressor support to maintain MAPs >65 -Continue broad spectrum abx  AKI - worsening -Monitor UOP, Cr -Daily BMP  Hypernatremia: -Sodium 148, continue to monitor for now -Discontinue free water due to volume overload -Repeat BMP in AM  Left ankle injury -S/p ORIF on 1/16 by Dr. Victorino Dike -Per ortho >> reinforce splint as needed, will need PT/OT as he becomes stable. No evidence of lymphedema or streaking up left leg.   Constipation: -Patient has not had a bowel movement for bowel  movement.  He had mag citrate added yesterday and is still not having any bowel movement.  He also have colace and senokot. -Enema versus lactulose -Hold tube feeds  Best practice:  Diet: N.p.o. Pain/Anxiety/Delirium protocol (if indicated): RASS goal -4 or 5 VAP protocol (if indicated): Yes DVT prophylaxis: Heparin GI prophylaxis: PPI Glucose control: SSI Mobility: Bed ridden Code Status: Full Family Communication: No family available Disposition: Remain in ICU  Labs   CBC: Recent Labs  Lab 09/25/18 0345 09/26/18 0419  09/28/18 0331 09/29/18 0331 09/29/18 0340 09/30/18 0715 09/30/18 1014  WBC 7.0 6.6  --  9.6 9.7  --  9.5  --   HGB 11.5* 11.4*   < > 11.7* 11.5* 11.9* 10.2* 10.9*  HCT 39.4 38.5*   < > 42.3 39.5 35.0* 36.3* 32.0*  MCV 97.0 97.0  --  97.9 98.8  --  98.4  --   PLT 274 301  --  337 351  --  330  --    < > = values in this interval not displayed.    Basic Metabolic Panel: Recent Labs  Lab 09/25/18 0345 09/25/18 1640  09/27/18 0341 09/28/18 0331 09/29/18 0331 09/29/18 0340 09/29/18 1630 09/30/18 0715 09/30/18 1014  NA 150*  --    < > 150* 147* 147* 149* 147* 148* 148*  K 3.7  --    < >  3.4* 3.8 4.0 4.1 4.5 4.4 4.3  CL 113*  --    < > 114* 115* 115*  --  117* 116*  --   CO2 26  --    < > 25 21* 22  --  19* 22  --   GLUCOSE 143*  --    < > 136* 168* 153*  --  121* 119*  --   BUN 57*  --    < > 57* 62* 79*  --  90* 101*  --   CREATININE 3.06*  --    < > 2.32* 2.37* 3.02*  --  3.74* 4.88*  --   CALCIUM 8.2*  --    < > 8.2* 8.0* 7.7*  --  7.8* 7.9*  --   MG 2.3 2.3  --   --   --   --   --   --   --   --   PHOS 4.4 3.5  --   --   --   --   --   --   --   --    < > = values in this interval not displayed.   GFR: Estimated Creatinine Clearance: 27.5 mL/min (A) (by C-G formula based on SCr of 4.88 mg/dL (H)). Recent Labs  Lab 09/26/18 0419 09/28/18 0331 09/29/18 0331 09/30/18 0715  WBC 6.6 9.6 9.7 9.5    Liver Function Tests: Recent Labs  Lab  09/26/18 0419 09/29/18 0331  AST 34 33  ALT 44 37  ALKPHOS 81 84  BILITOT 1.4* 2.2*  PROT 6.2* 5.6*  ALBUMIN 2.2* 1.7*   No results for input(s): LIPASE, AMYLASE in the last 168 hours. No results for input(s): AMMONIA in the last 168 hours.  ABG    Component Value Date/Time   PHART 7.302 (L) 09/30/2018 1014   PCO2ART 45.1 09/30/2018 1014   PO2ART 43.0 (L) 09/30/2018 1014   HCO3 22.3 09/30/2018 1014   TCO2 24 09/30/2018 1014   ACIDBASEDEF 4.0 (H) 09/30/2018 1014   O2SAT 74.0 09/30/2018 1014     CBG: Recent Labs  Lab 10/01/18 0429 10/01/18 0834 10/01/18 1220 10/01/18 1649 10/01/18 1958  GLUCAP 103* 109* 123* 79 108*     The patient is critically ill with multiple organ systems failure and requires high complexity decision making for assessment and support, frequent evaluation and titration of therapies, application of advanced monitoring technologies and extensive interpretation of multiple databases.   Critical Care Time devoted to patient care services described in this note is 33 Minutes. This time reflects time of care of this signee Dr. Mechele Collin. This critical care time does not reflect procedure time, or teaching time or supervisory time of PA/NP/Med student/Med Resident etc but could involve care discussion time.  Mechele Collin, M.D. [facility] Pulmonary/Critical Care Medicine Pager: [phone] After hours pager: [phone]

## 2018-10-01 NOTE — Progress Notes (Signed)
Pt continues to slowly desat, now high 60s to low 70s, full vent support, FiO2:100%.  Spoke w/ both pts gf to relay and CCM MD.

## 2018-10-01 NOTE — Progress Notes (Addendum)
Spoke w/ Vicky Autrey(pts main Location manager) as well as pts friend Scientist, research (physical sciences). Relayed that pts SpO2 continues to drop by the hour, now in mid 60s, despite pt being maxed out on ventilator. Further relayed that MD is aware. This RN encouraged Lynden Ang and Johnathan Hausen to come to hospital today as pt is not in a sustainable state. Both state understanding.

## 2018-10-01 DEATH — deceased

## 2018-10-02 LAB — GLUCOSE, CAPILLARY
GLUCOSE-CAPILLARY: 110 mg/dL — AB (ref 70–99)
GLUCOSE-CAPILLARY: 96 mg/dL (ref 70–99)
Glucose-Capillary: 117 mg/dL — ABNORMAL HIGH (ref 70–99)
Glucose-Capillary: 121 mg/dL — ABNORMAL HIGH (ref 70–99)
Glucose-Capillary: 123 mg/dL — ABNORMAL HIGH (ref 70–99)
Glucose-Capillary: 132 mg/dL — ABNORMAL HIGH (ref 70–99)
Glucose-Capillary: 91 mg/dL (ref 70–99)

## 2018-10-02 MED ORDER — CHLORHEXIDINE GLUCONATE CLOTH 2 % EX PADS
6.0000 | MEDICATED_PAD | Freq: Every day | CUTANEOUS | Status: DC
  Administered 2018-10-03: 6 via TOPICAL

## 2018-10-02 MED ORDER — SODIUM CHLORIDE 0.9% FLUSH: 10.0000 mL | INTRAVENOUS | Status: DC | PRN

## 2018-10-02 MED ORDER — SODIUM CHLORIDE 0.9% FLUSH
10.0000 mL | Freq: Two times a day (BID) | INTRAVENOUS | Status: DC
  Administered 2018-10-02 – 2018-10-03 (×2): 10 mL

## 2018-10-02 NOTE — Progress Notes (Addendum)
PCCM Progress / Consult Note  I've been asked to evaluate Rodney Hoffman, his current clinical status, prognosis to give insight into goals of care decision making.  He is a 53 year old obese gentleman with a history of tobacco abuse, diabetes, hypertension, OSA/OHS with chronic respiratory failure.  I saw him earlier in this hospitalization when he presented with acute on chronic respiratory failure following ORIF done on his left ankle.  At the time he was obtunded with evolving bilateral pulmonary infiltrates, hypoxemic.  He required urgent intubation and ventilation.  Was felt that it was possible that he had experienced either diastolic CHF for negative pressure pulmonary edema and diuresis was attempted.  He was found to have hypoxemic and hypercapnic respiratory failure and developed shock after intubation, sedation, positive pressure support.  He was treated with ARDS ventilator management, with antibiotics for possible pneumonia and septic shock.  Patient improved enough to allow an attempt of extubation 1/23.  He unfortunately failed, developed respiratory distress and evolved to pulseless V. tach requiring reintubation and CPR.  His bilateral infiltrates worsened as did his gas exchange.  Unfortunately he has not stabilized since.  He is continued to be hypoxemic with standard ARDS therapies, paralytics.  He is not a candidate for proning due to his shock.  He has concomitant shock, presumed septic shock that is been refractory to therapy.  Review of his lab work unfortunately reveals progressive acute renal failure.  He is now anuric, +13.6 L for the hospitalization.  He is an extremely poor candidate for hemodialysis, would require continuous HD and is unclear that he can tolerate.  Unfortunately this patient's prognosis for meaningful recovery has become very small.  His PEEP and FiO2 needs are extremely high, currently on 100% and PEEP 14 with SPO2 in the 80s.  His chest imaging shows diffuse  severe bilateral alveolar infiltrates with some very slight peripheral sparing.  Diuresis is not possible, he is not a candidate for ECMO.  I do not believe that he can survive this illness.  I believe that, based on best assessment of his own medical wishes that we have obtained from his friends who have visited, the appropriate decision here is to pursue compassionate transition to a comfort care mode.  There is no family to assume the role of healthcare power of attorney.  Therefore I believe that based on multiple individual physician assessments we are compelled to pursue a transition to palliative care on the patient's behalf.  I support Dr. George Hugh recommendations for withdrawal of care and transition to comfort.  Independent CC time 30 minutes  Levy Pupa, MD, PhD 10/02/2018, 7:01 PM [facility] [phone] or if no answer [phone]

## 2018-10-02 NOTE — Progress Notes (Signed)
Pharmacy Antibiotic Note  Rodney Hoffman is a 53 y.o. male admitted on 10-06-18 for intubation due to postoperative respiratory failure s/p ORIF surgery. Patient has not improved clinically and is with suspected infection of unknown origin.   Continues to sat poorly on vent  Plan: - Vancomycin 1250mg  IV every 24 hours - cefepime to 2g IV every 24 hours -F/U LOT abx  Height: 6\' 1"  (185.4 cm) Weight: (!) 361 lb 15.9 oz (164.2 kg) IBW/kg (Calculated) : 79.9  Temp (24hrs), Avg:100.1 F (37.8 C), Min:98.2 F (36.8 C), Max:101.9 F (38.8 C)  Recent Labs  Lab 09/26/18 0419 09/27/18 0341 09/28/18 0331 09/29/18 0331 09/29/18 1230 09/29/18 1630 09/30/18 0715  WBC 6.6  --  9.6 9.7  --   --  9.5  CREATININE 2.43* 2.32* 2.37* 3.02*  --  3.74* 4.88*  VANCORANDOM  --   --   --   --  21  --   --     Estimated Creatinine Clearance: 27.8 mL/min (A) (by C-G formula based on SCr of 4.88 mg/dL (H)).    No Known Allergies  Isaac Bliss, PharmD, BCPS, BCCCP Clinical Pharmacist [phone]  Please check AMION for all [facility] Pharm[facility] numbers  10/02/2018 12:32 PM

## 2018-10-02 NOTE — Progress Notes (Signed)
Spoke w/ pts close friend Mallie Mussel and updated her regarding MDs' discussion regarding comfort cares and patients wishes. Shiree states understanding and that she will update Eilene Ghazi as well.

## 2018-10-02 NOTE — Progress Notes (Signed)
NAME:  Rodney Hoffman, MRN:  [id number], DOB:  [birth year], LOS: 17 ADMISSION DATE:  Sep 29, 2018, CONSULTATION DATE:  1/16 REFERRING MD:  Dr. Victorino Dike, CHIEF COMPLAINT:  Post op respiratory failure  Brief History   This is a 53 year old male with a history of HTN, OSA and DM who after getting a ORIF of his left fibular and tibial developed hypoxic and hypercarbic respiratory failure requiring intubation and transfer to [facility] and the ICU for airway management.  History of present illness   53 year old male with past medical history as below, which is significant for tobacco abuse and diabetes.  The patient is encephalopathic and therefore history is obtained largely from chart review.  He suffered a left ankle fracture about a year ago and after some lifestyle modification and better management of his diabetes he presented on 1/16 to the outpatient surgery center for left ankle ORIF under Dr. Victorino Dike.  He was extubated immediately postoperatively, however, he then developed hypoxemia with sats down into the low 80s and altered mental status progressing to obtundation.  He was transferred to [facility] for airway management.  He was given 120 mg IV lasix and had poor output, he became profoundly hypotensive. Bedside u/s showed dilated RV and hyperdynamic LV. Cardiology called and patient was started on levophed drip. He initially had respiratory acidosis and was started on bicarbonate drip, follow up ABG showed respiratory alkalosis so bicarbonate drip was stopped.    Past Medical History   Past Medical History:  Diagnosis Date  . Diabetes mellitus without complication (HCC)   . Hypertension   . Sleep apnea    does not wear CPAP     Significant Hospital Events   1/16 ORIF L ankle. Failed extubation. Transfer to [facility] 1/23 > Failed extubation, CODE BLUE called for respiratory distress and pulseless V tach and reintubated  Consults:    Procedures:  1/16 L ankle ORIF (Dr.  Victorino Dike) 1/16 ETT 1/23 ETT  Significant Diagnostic Tests:   Echocardiogram 09/16/18 Impressions:  - Normal LV size with moderate LV hypertrophy. EF 50-55%, low   normal to mildly decreased. Basal inferior akinesis. Mildly   dilated RV with mild to moderately decreased systolic function.   Dilated IVC suggestive of elevated RV filling pressure.  CT head 09/21/18   IMPRESSION: No acute intracranial normality.  Pansinus disease and bilateral mastoid effusions.  CT Chest 09/27/18 IMPRESSION: 1. Nonspecific diffuse bilateral consolidation, greatest in the UPPER lungs. This may represent edema and/or infection. 2. Small bilateral pleural effusions and moderate bilateral LOWER lung consolidation/atelectasis. 3. Cardiomegaly.  Micro Data:  Blood cultures 1/18 > strep viridans in 1/4 bottles, possible contaminant Blood cultures 1/22 > NGTD Blood cultures 1/27> no growth to date Respiratory culture 1/27> rare staph aureus Antimicrobials:      Interim history/subjective:  Persistent hypoxemia despite current medical management.  On 1/31 Discussed case with risk management, patient with no living relatives. Ms. Cherlynn Polo, who has lived with patient and has an established relationship of >20 years and acts in good faith on patients half, states that patient would not wish to pursue aggressive care if this would not provide meaningful recovery. Per discussion, patient transitioned to DNR.  Objective   Blood pressure 119/63, pulse 88, temperature 98.2 F (36.8 C), temperature source Axillary, resp. rate (!) 24, height 6\' 1"  (1.854 m), weight (!) 164.2 kg, SpO2 (!) 83 %.    Vent Mode: PRVC FiO2 (%):  [100 %] 100 % Set Rate:  [  14 bmp-24 bmp] 24 bmp Vt Set:  [550 mL] 550 mL PEEP:  [5 cmH20-14 cmH20] 5 cmH20 Plateau Pressure:  [34 cmH20-37 cmH20] 36 cmH20   Intake/Output Summary (Last 24 hours) at 10/02/2018 1417 Last data filed at 10/02/2018 1200 Gross per 24 hour  Intake 1649.45 ml   Output 32 ml  Net 1617.45 ml   Filed Weights   09/30/18 0447 10/01/18 0500 10/02/18 0500  Weight: (!) 159.6 kg (!) 160.8 kg (!) 164.2 kg   Physical Exam: General: MO Critically ill appearing male, intubated HENT: [place], AT, ETT in place Eyes: EOMI, no scleral icterus Respiratory: Coarse breath sounds bilaterally, no wheezing Cardiovascular: RRR, -M/R/G, no JVD GI: BS+, soft, nontender Extremities: 2+pitting edema, left leg cast, no tenderness Neuro: Sedated, unable to follow commands Skin: Intact, no rashes or bruising GU: Foley in place  Resolved Hospital Problem list     Assessment & Plan:   ARDS: Acute hypoxic respiratory failure:  Septic Shock Acute renal failure Unresponsive to trials of paralytics. Given co-comitant shock and physical limitations, proning is contraindicated and may likely cause more harm than benefit. Grim prognosis. At this point, I believe patient's condition is non-survivable and further medical intervention is unlikely to provide meaningful recovery. Patient would not benefit from further medical management and I recommend withdrawal of care. I have discussed case with Dr. Delton CoombesByrum, Pulmonary and Critical Care provider, to review and to provide medical opinion regarding patient's clinical status and futility in care. -Continue full vent support  -Continue broad spectrum antibiotics -Continue sedation with RASS goal -5 -Continue pressor support to maintain MAPs >65 -Monitor UOP  Left ankle injury -S/p ORIF on 1/16 by Dr. Victorino DikeHewitt -Per ortho >> reinforce splint as needed, will need PT/OT as he becomes stable. No evidence of lymphedema or streaking up left leg.    Best practice:  Diet: N.p.o. Pain/Anxiety/Delirium protocol (if indicated): RASS goal -4 or 5 VAP protocol (if indicated): Yes DVT prophylaxis: Heparin GI prophylaxis: PPI Glucose control: SSI Mobility: Bed ridden Code Status: DNR Family Communication: No family available. Friend requesting  update. Disposition: Remain in ICU  Labs   CBC: Recent Labs  Lab 09/26/18 0419  09/28/18 0331 09/29/18 0331 09/29/18 0340 09/30/18 0715 09/30/18 1014  WBC 6.6  --  9.6 9.7  --  9.5  --   HGB 11.4*   < > 11.7* 11.5* 11.9* 10.2* 10.9*  HCT 38.5*   < > 42.3 39.5 35.0* 36.3* 32.0*  MCV 97.0  --  97.9 98.8  --  98.4  --   PLT 301  --  337 351  --  330  --    < > = values in this interval not displayed.    Basic Metabolic Panel: Recent Labs  Lab 09/25/18 1640  09/27/18 0341 09/28/18 0331 09/29/18 0331 09/29/18 0340 09/29/18 1630 09/30/18 0715 09/30/18 1014  NA  --    < > 150* 147* 147* 149* 147* 148* 148*  K  --    < > 3.4* 3.8 4.0 4.1 4.5 4.4 4.3  CL  --    < > 114* 115* 115*  --  117* 116*  --   CO2  --    < > 25 21* 22  --  19* 22  --   GLUCOSE  --    < > 136* 168* 153*  --  121* 119*  --   BUN  --    < > 57* 62* 79*  --  90* 101*  --  CREATININE  --    < > 2.32* 2.37* 3.02*  --  3.74* 4.88*  --   CALCIUM  --    < > 8.2* 8.0* 7.7*  --  7.8* 7.9*  --   MG 2.3  --   --   --   --   --   --   --   --   PHOS 3.5  --   --   --   --   --   --   --   --    < > = values in this interval not displayed.   GFR: Estimated Creatinine Clearance: 27.8 mL/min (A) (by C-G formula based on SCr of 4.88 mg/dL (H)). Recent Labs  Lab 09/26/18 0419 09/28/18 0331 09/29/18 0331 09/30/18 0715  WBC 6.6 9.6 9.7 9.5    Liver Function Tests: Recent Labs  Lab 09/26/18 0419 09/29/18 0331  AST 34 33  ALT 44 37  ALKPHOS 81 84  BILITOT 1.4* 2.2*  PROT 6.2* 5.6*  ALBUMIN 2.2* 1.7*   No results for input(s): LIPASE, AMYLASE in the last 168 hours. No results for input(s): AMMONIA in the last 168 hours.  ABG    Component Value Date/Time   PHART 7.302 (L) 09/30/2018 1014   PCO2ART 45.1 09/30/2018 1014   PO2ART 43.0 (L) 09/30/2018 1014   HCO3 22.3 09/30/2018 1014   TCO2 24 09/30/2018 1014   ACIDBASEDEF 4.0 (H) 09/30/2018 1014   O2SAT 74.0 09/30/2018 1014     CBG: Recent Labs   Lab 10/01/18 1958 10/02/18 0004 10/02/18 0423 10/02/18 0834 10/02/18 1119  GLUCAP 108* 121* 132* 117* 123*    The patient is critically ill with multiple organ systems failure and requires high complexity decision making for assessment and support, frequent evaluation and titration of therapies, application of advanced monitoring technologies and extensive interpretation of multiple databases.   Critical Care Time devoted to patient care services described in this note is 33 Minutes. This time reflects time of care of this signee Dr. Mechele Collin. This critical care time does not reflect procedure time, or teaching time or supervisory time of PA/NP/Med student/Med Resident etc but could involve care discussion time.  Mechele Collin, M.D. [facility] Pulmonary/Critical Care Medicine Pager: [phone] After hours pager: [phone]

## 2018-10-03 LAB — GLUCOSE, CAPILLARY
GLUCOSE-CAPILLARY: 73 mg/dL (ref 70–99)
GLUCOSE-CAPILLARY: 67 mg/dL — AB (ref 70–99)
Glucose-Capillary: 66 mg/dL — ABNORMAL LOW (ref 70–99)
Glucose-Capillary: 77 mg/dL (ref 70–99)
Glucose-Capillary: 79 mg/dL (ref 70–99)
Glucose-Capillary: 90 mg/dL (ref 70–99)

## 2018-10-03 MED ORDER — DEXTROSE 50 % IV SOLN
25.0000 mL | Freq: Once | INTRAVENOUS | Status: AC
  Administered 2018-10-03: 25 mL via INTRAVENOUS

## 2018-10-03 MED ORDER — GLYCOPYRROLATE 0.2 MG/ML IJ SOLN: 0.2000 mg | INTRAMUSCULAR | Status: DC | PRN

## 2018-10-03 MED ORDER — DIPHENHYDRAMINE HCL 50 MG/ML IJ SOLN: 25.0000 mg | INTRAMUSCULAR | Status: DC | PRN

## 2018-10-03 MED ORDER — DEXTROSE 5 % IV SOLN: INTRAVENOUS | Status: DC

## 2018-10-03 MED ORDER — ACETAMINOPHEN 325 MG PO TABS: 650.0000 mg | ORAL_TABLET | Freq: Four times a day (QID) | ORAL | Status: DC | PRN

## 2018-10-03 MED ORDER — ACETAMINOPHEN 650 MG RE SUPP: 650.0000 mg | Freq: Four times a day (QID) | RECTAL | Status: DC | PRN

## 2018-10-03 MED ORDER — GLYCOPYRROLATE 1 MG PO TABS: 1.0000 mg | ORAL_TABLET | ORAL | Status: DC | PRN

## 2018-10-03 MED ORDER — DEXTROSE 50 % IV SOLN
INTRAVENOUS | Status: AC
  Filled 2018-10-03: qty 50

## 2018-10-03 MED ORDER — POLYVINYL ALCOHOL 1.4 % OP SOLN: 1.0000 [drp] | Freq: Four times a day (QID) | OPHTHALMIC | Status: DC | PRN

## 2018-10-30 NOTE — Discharge Summary (Signed)
Physician Discharge Summary  Patient ID: Rodney Hoffman MRN: [id number] DOB/AGE: Nov 16, 1965 53 y.o.  Admit date: 09/28/2018 Discharge date: 11/02/2018  Admission Diagnoses: Respiratory failure  Discharge Diagnoses:  Acute respiratory failure ARDS Shock Acute renal failure  Discharged Condition: Deceased  Hospital Course:  53 year old male with past medical history as below, which is significant for tobacco abuse and diabetes.  He presented on 1/16 to the outpatient surgery center for left ankle ORIF under Dr. Victorino Dike. He was extubated immediately postoperatively, however, he then developed hypoxemia with sats down into the low 80s and altered mental status progressing to obtundation. He was transferred to [facility] for airway management. On 1/23 was extubated but failed immediately.  CODE BLUE called for respiratory distress and pulseless V. tach and he was reintubated  Continue to remain in the ICU with severe ARDS, acute respiratory failure with no progress on the ventilator. Unable to prone due to shock.  He is not a candidate for ECMO The prognosis for recovery was extremely grim and recommended transitioning to comfort care  There is no family to guide [place] with his preferences for continued medical management The medical team discussed case with risk management 2/3.  In the absence of family his significant other of 20 years, Rodney Hoffman and his friends are [employer] officer.  We believe that they are acting in good faith on behalf of the patient and can reliably convey the patient's wishes  The ICU team discussed with Rodney Hoffman and Rodney Hoffman (his friend) at bedside and reviewed clinical course to date.  They categorically state that he would not want to be supported in this manner and have agreed to withdrawal of care and transition to comfort. Orders for morphine drip and terminal extubation placed.  Patient passed away shortly after extubation on 2/3.  Signed: Kimberley Dastrup 10/14/2018, 3:10 PM

## 2018-10-30 NOTE — Progress Notes (Signed)
   10-25-2018 1700  Clinical Encounter Type  Visited With Patient and family together  Visit Type Follow-up;Patient actively dying  Support family as patient is actively dying. Read sacred text and prayed.

## 2018-10-30 NOTE — Progress Notes (Signed)
IV hydromorphone 200 mg/100 mL returned to the pharmacy by nursing. Wasted in IT trainer in [employer]. Renard Hamper, PharmD, was witness.

## 2018-10-30 NOTE — Progress Notes (Signed)
2 RNs present for time of death of patient. MD notified

## 2018-10-30 NOTE — Progress Notes (Signed)
Hypoglycemic Event  CBG: 67  Treatment: D50 25 mL (12.5 gm)  Symptoms: None  Follow-up CBG: Time:1145 CBG Result: 90  Possible Reasons for Event: Inadequate meal intake  Comments/MD notified: MD notified. States to continue with hypoglycemic protocol    Rodney Hoffman A

## 2018-10-30 NOTE — Progress Notes (Signed)
Friends of patient at bedside with RT and this RN for comfort extubation. Pressors will be discontinued and comfort care continued.

## 2018-10-30 NOTE — Progress Notes (Signed)
Nutrition Brief Note  Chart reviewed. Plan of Care dicussed with CCM.  TF off since 01/30. Pt now DNR. Noted plan to transition to comfort care. No TF per CCM No further nutrition interventions warranted at this time.  Please re-consult as needed.   Romelle Starcher MS, RD, LDN, CNSC [phone] Pager  [phone] Weekend/On-Call Pager

## 2018-10-30 NOTE — Progress Notes (Signed)
NAME:  Rodney Hoffman, MRN:  [id number], DOB:  [birth year], LOS: 18 ADMISSION DATE:  09/19/2018, CONSULTATION DATE:  1/16 REFERRING MD:  Dr. Victorino Dike, CHIEF COMPLAINT:  Post op respiratory failure  Brief History   This is a 53 year old male with a history of HTN, OSA and DM who after getting a ORIF of his left fibular and tibial developed hypoxic and hypercarbic respiratory failure requiring intubation and transfer to [facility] and the ICU for airway management.  History of present illness   53 year old male with past medical history as below, which is significant for tobacco abuse and diabetes.  The patient is encephalopathic and therefore history is obtained largely from chart review.  He suffered a left ankle fracture about a year ago and after some lifestyle modification and better management of his diabetes he presented on 1/16 to the outpatient surgery center for left ankle ORIF under Dr. Victorino Dike.  He was extubated immediately postoperatively, however, he then developed hypoxemia with sats down into the low 80s and altered mental status progressing to obtundation.  He was transferred to [facility] for airway management.  He was given 120 mg IV lasix and had poor output, he became profoundly hypotensive. Bedside u/s showed dilated RV and hyperdynamic LV. Cardiology called and patient was started on levophed drip. He initially had respiratory acidosis and was started on bicarbonate drip, follow up ABG showed respiratory alkalosis so bicarbonate drip was stopped.    Past Medical History   Past Medical History:  Diagnosis Date  . Diabetes mellitus without complication (HCC)   . Hypertension   . Sleep apnea    does not wear CPAP     Significant Hospital Events   1/16 ORIF L ankle. Failed extubation. Transfer to [facility] 1/23 > Failed extubation, CODE BLUE called for respiratory distress and pulseless V tach and reintubated  Consults:    Procedures:  1/16 L ankle ORIF (Dr.  Victorino Dike) 1/16 ETT 1/23 ETT  Significant Diagnostic Tests:   Echocardiogram 09/16/18 Impressions:  - Normal LV size with moderate LV hypertrophy. EF 50-55%, low   normal to mildly decreased. Basal inferior akinesis. Mildly   dilated RV with mild to moderately decreased systolic function.   Dilated IVC suggestive of elevated RV filling pressure.  CT head 09/21/18   IMPRESSION: No acute intracranial normality.  Pansinus disease and bilateral mastoid effusions.  CT Chest 09/27/18 IMPRESSION: 1. Nonspecific diffuse bilateral consolidation, greatest in the UPPER lungs. This may represent edema and/or infection. 2. Small bilateral pleural effusions and moderate bilateral LOWER lung consolidation/atelectasis. 3. Cardiomegaly.  Micro Data:  Blood cultures 1/18 > strep viridans in 1/4 bottles, possible contaminant Blood cultures 1/22 > NGTD Blood cultures 1/27> no growth to date Respiratory culture 1/27> rare staph aureus Antimicrobials:      Interim history/subjective:  Persistent hypoxia unchanged respiratory status.  Continues to require very high vent support with high peaks and plateaus  Objective   Blood pressure (!) 114/55, pulse 87, temperature 98.6 F (37 C), temperature source Oral, resp. rate (!) 24, height 6\' 1"  (1.854 m), weight (!) 164.1 kg, SpO2 (!) 89 %.    Vent Mode: PCV FiO2 (%):  [100 %] 100 % Set Rate:  [24 bmp] 24 bmp Vt Set:  [550 mL] 550 mL PEEP:  [14 cmH20] 14 cmH20 Plateau Pressure:  [32 cmH20-37 cmH20] 34 cmH20   Intake/Output Summary (Last 24 hours) at 10/10/2018 1533 Last data filed at 10/10/2018 1400 Gross per 24 hour  Intake 1573.24 ml  Output 250 ml  Net 1323.24 ml   Filed Weights   10/01/18 0500 10/02/18 0500 10/07/2018 0500  Weight: (!) 160.8 kg (!) 164.2 kg (!) 164.1 kg   Physical Exam: Gen:      No acute distress HEENT:  EOMI, sclera anicteric Neck:     No masses; no thyromegaly, ET tube Lungs:    Clear to auscultation bilaterally;  normal respiratory effort CV:         Regular rate and rhythm; no murmurs Abd:      + bowel sounds; soft, non-tender; no palpable masses, no distension Ext:    No edema; adequate peripheral perfusion Skin:      Warm and dry; no rash Neuro: Sedated, unresponsive.  Resolved Hospital Problem list     Assessment & Plan:   ARDS: Acute hypoxic respiratory failure:  Septic Shock Acute renal failure  Continues to be in severe ARDS, unresponsive to therapy Unable to prone due to shock.  He is not a candidate for ECMO Agree with assessment of Dr. Everardo AllEllison and Dr. Delton CoombesByrum that prognosis for recovery is extremely grim He will not survive this illness.  There is no family to guide us with his preferences for continued medical management I discussed case with risk management today 2/3.  In the absence of family his significant other of 20 years Jerral RalphVicky Autry and his friends are Merchant navy officersurrogate decision makers.  We believe that they are acting in good faith on behalf of the patient and can reliably convey the patient's wishes  I discussed with Lynden AngVicky and Marlynn PerkingSharie (his friend) at bedside and reviewed clinical course to date.  They categorically state that he would not want to be supported in this manner and have agreed to withdrawal of care and transition to comfort. Orders for morphine drip and terminal extubation placed.  Best practice:  Diet: N.p.o. Pain/Anxiety/Delirium protocol (if indicated): RASS goal -4 or 5 VAP protocol (if indicated): Yes DVT prophylaxis: Heparin GI prophylaxis: PPI Glucose control: SSI Mobility: Bed ridden Code Status: DNR Family Communication: No family available. Disposition: Remain in ICU  Labs   CBC: Recent Labs  Lab 09/28/18 0331 09/29/18 0331 09/29/18 0340 09/30/18 0715 09/30/18 1014  WBC 9.6 9.7  --  9.5  --   HGB 11.7* 11.5* 11.9* 10.2* 10.9*  HCT 42.3 39.5 35.0* 36.3* 32.0*  MCV 97.9 98.8  --  98.4  --   PLT 337 351  --  330  --     Basic Metabolic  Panel: Recent Labs  Lab 09/27/18 0341 09/28/18 0331 09/29/18 0331 09/29/18 0340 09/29/18 1630 09/30/18 0715 09/30/18 1014  NA 150* 147* 147* 149* 147* 148* 148*  K 3.4* 3.8 4.0 4.1 4.5 4.4 4.3  CL 114* 115* 115*  --  117* 116*  --   CO2 25 21* 22  --  19* 22  --   GLUCOSE 136* 168* 153*  --  121* 119*  --   BUN 57* 62* 79*  --  90* 101*  --   CREATININE 2.32* 2.37* 3.02*  --  3.74* 4.88*  --   CALCIUM 8.2* 8.0* 7.7*  --  7.8* 7.9*  --    GFR: Estimated Creatinine Clearance: 27.8 mL/min (A) (by C-G formula based on SCr of 4.88 mg/dL (H)). Recent Labs  Lab 09/28/18 0331 09/29/18 0331 09/30/18 0715  WBC 9.6 9.7 9.5    Liver Function Tests: Recent Labs  Lab 09/29/18 0331  AST 33  ALT 37  ALKPHOS 84  BILITOT 2.2*  PROT 5.6*  ALBUMIN 1.7*   No results for input(s): LIPASE, AMYLASE in the last 168 hours. No results for input(s): AMMONIA in the last 168 hours.  ABG    Component Value Date/Time   PHART 7.302 (L) 09/30/2018 1014   PCO2ART 45.1 09/30/2018 1014   PO2ART 43.0 (L) 09/30/2018 1014   HCO3 22.3 09/30/2018 1014   TCO2 24 09/30/2018 1014   ACIDBASEDEF 4.0 (H) 09/30/2018 1014   O2SAT 74.0 09/30/2018 1014     CBG: Recent Labs  Lab 10/12/2018 0019 10/28/2018 0351 10/06/2018 0744 10/24/2018 1110 10/04/2018 1150  GLUCAP 77 79 73 67* 90    The patient is critically ill with multiple organ system failure and requires high complexity decision making for assessment and support, frequent evaluation and titration of therapies, advanced monitoring, review of radiographic studies and interpretation of complex data.   Critical Care Time devoted to patient care services, exclusive of separately billable procedures, described in this note is 45 minutes.   Chilton Greathouse MD [facility]monary and Critical [facility] Pager [phone] If no answer call [phone] 10/12/2018, 3:38 PM

## 2018-10-30 NOTE — Progress Notes (Signed)
RT NOTE: RT removed patient from ventilator per order and family wishes. Per night RT patient will be ME case so RT did not extubate patient or remove endotracheal tube.  MD was notified and agreed to leave endotracheal tube in. RT will continue to monitor.

## 2018-10-30 NOTE — Progress Notes (Signed)
Subjective: 18 Days Post-Op Procedure(s) (LRB): Left fibular and tibial open treatment with internal fixation (Left) Mr. Rodney Hoffman is still intubated in the ICU now 2.5 weeks after his ankle surgery.  I've reviewed the CCM notes in detail.  Unfortunately he has continued to decline and is now DNR.  I've conferred with the patient's nurse and the CCM attending, Dr. Isaiah SergeMannam.    Objective: Vital signs in last 24 hours: Temp:  [97.8 F (36.6 C)-98.7 F (37.1 C)] 98.6 F (37 C) (02/03 1121) Pulse Rate:  [82-99] 88 (02/03 1159) Resp:  [17-26] 24 (02/03 1159) BP: (108-131)/(51-69) 112/57 (02/03 1159) SpO2:  [73 %-93 %] 93 % (02/03 1225) Arterial Line BP: (82-127)/(48-78) 82/78 (02/03 1000) FiO2 (%):  [100 %] 100 % (02/03 1225) Weight:  [164.1 kg] 164.1 kg (02/03 0500)  Intake/Output from previous day: 02/02 0701 - 02/03 0700 In: 1613.9 [I.V.:928.9; NG/GT:320; IV Piggyback:365] Out: 257 [Urine:7; Emesis/NG output:250] Intake/Output this shift: Total I/O In: 345 [I.V.:100; NG/GT:245] Out: -   No results for input(s): HGB in the last 72 hours. No results for input(s): WBC, RBC, HCT, PLT in the last 72 hours. No results for input(s): NA, K, CL, CO2, BUN, CREATININE, GLUCOSE, CALCIUM in the last 72 hours. No results for input(s): LABPT, INR in the last 72 hours.  obese male intubated in the ICU.  L LE splinted.    Assessment/Plan: 18 Days Post-Op Procedure(s) (LRB): Left fibular and tibial open treatment with internal fixation (Left)  In light of the patient's continued decline and poor prognosis I agree with the transition to DNR and initiation of comfort care.  I appreciate the diligent efforts of the CCM team in their care for Mr. Rodney Hoffman.    Rodney ArthursJohn Nisreen Hoffman 10/06/2018, 12:43 PM

## 2018-10-30 DEATH — deceased

## 2020-08-01 IMAGING — DX DG CHEST 1V PORT
1 series · 1 of 1 positions shown · non-contrast
Comparison: Chest x-rays dated 09/22/2018

CLINICAL DATA: Respiratory failure

EXAM:
PORTABLE CHEST 1 VIEW

[chest]
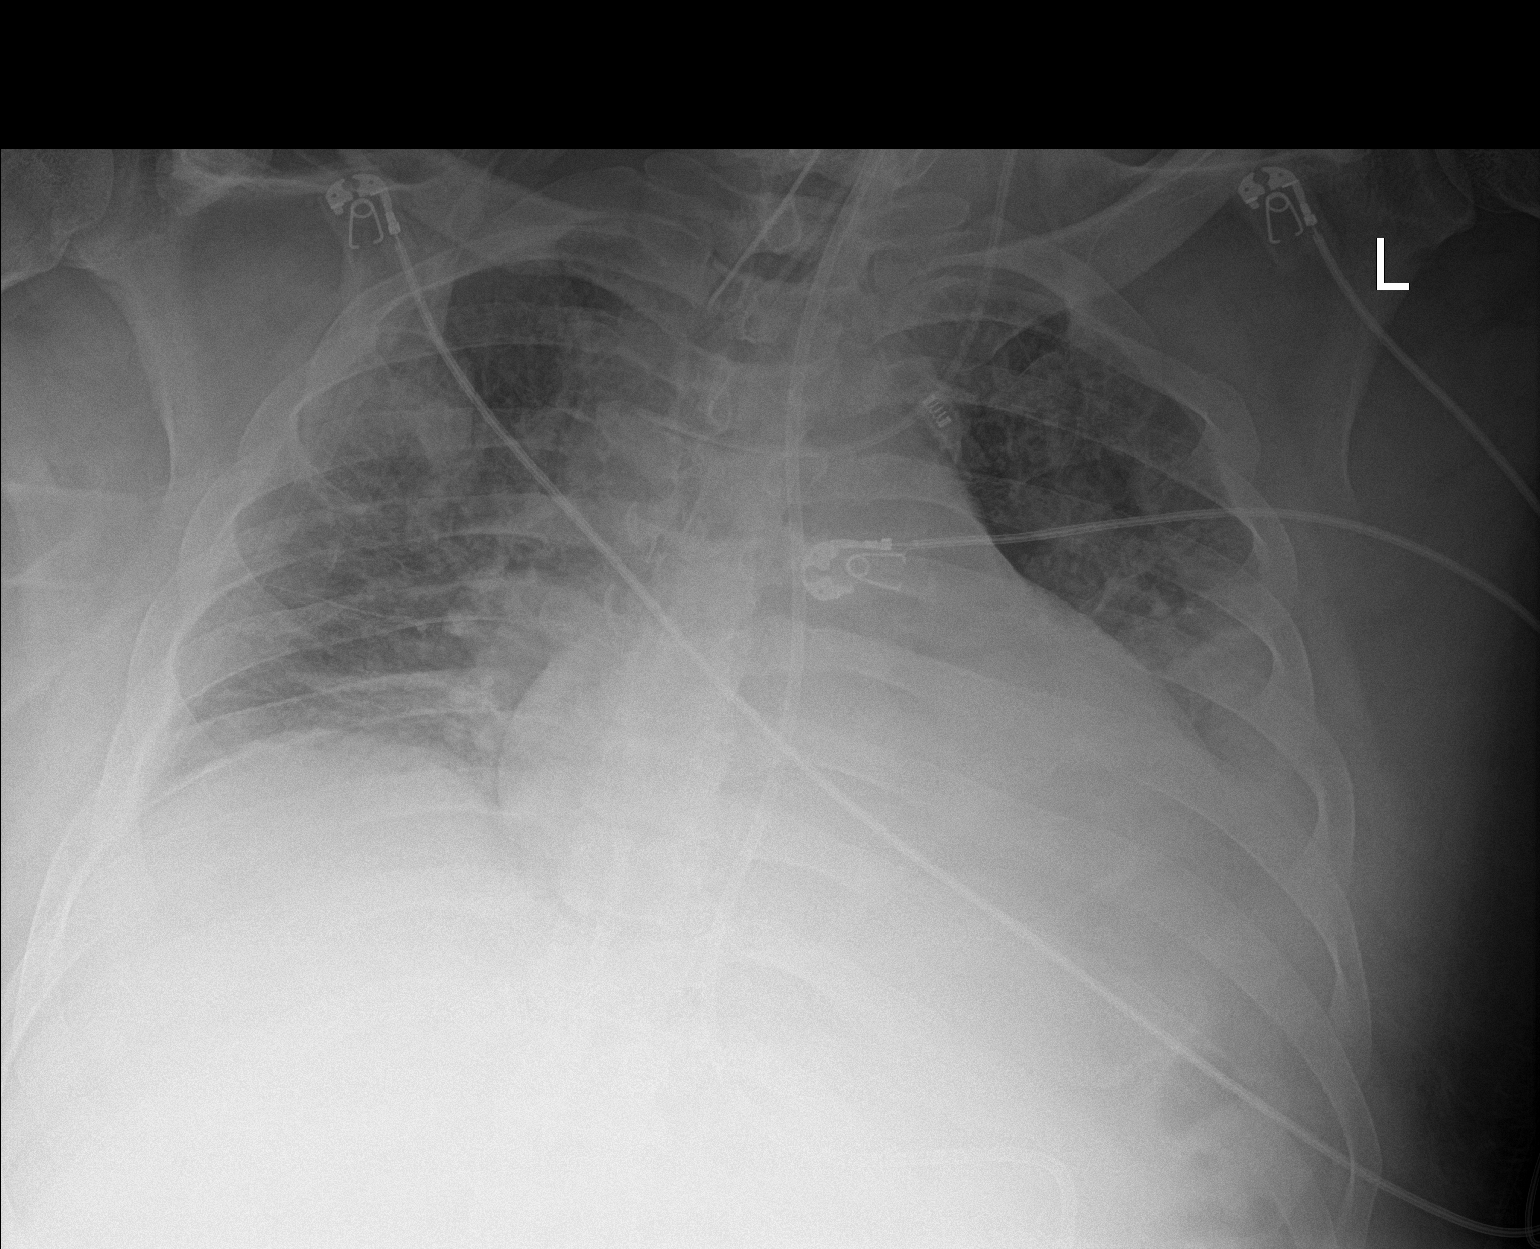

[1 of 1 positions shown; findings below may reference images not displayed]

FINDINGS: Endotracheal tube is well positioned with tip approximately 3-4 cm
above the carina. Enteric tube passes below the diaphragm. LEFT IJ
central line is stable in position with tip at the level of the
upper SVC.

Low lung volumes. Bilateral interstitial prominence, suspected
interstitial edema, likely accentuated by the low lung volumes.
Probable bibasilar atelectasis. No pneumothorax seen.
IMPRESSION: 1. Low lung volumes. Probable bilateral interstitial edema and
bibasilar atelectasis.
2. Support apparatus is stable in position.
